# Patient Record
Sex: Female | Born: 1935 | Race: White | Hispanic: No | Marital: Married | Smoking: Never smoker
Health system: Southern US, Community
[De-identification: ages and names within clinical notes are randomized; demographics above are authoritative.]

## PROBLEM LIST (undated history)

## (undated) DIAGNOSIS — F329 Major depressive disorder, single episode, unspecified: Secondary | ICD-10-CM

## (undated) DIAGNOSIS — E7800 Pure hypercholesterolemia, unspecified: Secondary | ICD-10-CM

## (undated) DIAGNOSIS — E119 Type 2 diabetes mellitus without complications: Secondary | ICD-10-CM

## (undated) DIAGNOSIS — M810 Age-related osteoporosis without current pathological fracture: Secondary | ICD-10-CM

## (undated) DIAGNOSIS — I10 Essential (primary) hypertension: Secondary | ICD-10-CM

## (undated) DIAGNOSIS — F32A Depression, unspecified: Secondary | ICD-10-CM

## (undated) HISTORY — PX: ABDOMINAL HYSTERECTOMY: SHX81

## (undated) HISTORY — PX: APPENDECTOMY: SHX54

## (undated) HISTORY — PX: CHOLECYSTECTOMY: SHX55

---

## 2007-04-06 ENCOUNTER — Ambulatory Visit: Payer: Self-pay | Admitting: Family Medicine

## 2007-06-16 ENCOUNTER — Ambulatory Visit: Payer: Self-pay | Admitting: Internal Medicine

## 2007-09-22 ENCOUNTER — Ambulatory Visit: Payer: Self-pay | Admitting: Family Medicine

## 2008-06-18 ENCOUNTER — Ambulatory Visit: Payer: Self-pay | Admitting: Family Medicine

## 2008-07-02 ENCOUNTER — Ambulatory Visit: Payer: Self-pay | Admitting: Family Medicine

## 2009-04-08 ENCOUNTER — Ambulatory Visit: Payer: Self-pay | Admitting: Unknown Physician Specialty

## 2009-06-20 ENCOUNTER — Ambulatory Visit: Payer: Self-pay | Admitting: Internal Medicine

## 2010-09-10 ENCOUNTER — Ambulatory Visit: Payer: Self-pay | Admitting: Internal Medicine

## 2011-12-09 ENCOUNTER — Ambulatory Visit: Payer: Self-pay | Admitting: Internal Medicine

## 2012-11-19 ENCOUNTER — Ambulatory Visit: Payer: Self-pay | Admitting: Family Medicine

## 2013-03-31 ENCOUNTER — Emergency Department: Payer: Self-pay | Admitting: Emergency Medicine

## 2013-03-31 LAB — BASIC METABOLIC PANEL
Anion Gap: 4 — ABNORMAL LOW (ref 7–16)
BUN: 14 mg/dL (ref 7–18)
CALCIUM: 9.7 mg/dL (ref 8.5–10.1)
Chloride: 108 mmol/L — ABNORMAL HIGH (ref 98–107)
Co2: 27 mmol/L (ref 21–32)
Creatinine: 0.89 mg/dL (ref 0.60–1.30)
EGFR (African American): >60
Glucose: 178 mg/dL — ABNORMAL HIGH (ref 65–99)
Osmolality: 282 (ref 275–301)
Potassium: 3.8 mmol/L (ref 3.5–5.1)
SODIUM: 139 mmol/L (ref 136–145)

## 2013-03-31 LAB — CBC
HCT: 45.7 % (ref 35.0–47.0)
HGB: 15.2 g/dL (ref 12.0–16.0)
MCH: 30 pg (ref 26.0–34.0)
MCHC: 33.2 g/dL (ref 32.0–36.0)
MCV: 90 fL (ref 80–100)
PLATELETS: 178 10*3/uL (ref 150–440)
RBC: 5.06 10*6/uL (ref 3.80–5.20)
RDW: 12.6 % (ref 11.5–14.5)
WBC: 8.4 10*3/uL (ref 3.6–11.0)

## 2013-03-31 LAB — TROPONIN I

## 2013-11-24 DIAGNOSIS — E119 Type 2 diabetes mellitus without complications: Secondary | ICD-10-CM | POA: Insufficient documentation

## 2013-11-24 DIAGNOSIS — E559 Vitamin D deficiency, unspecified: Secondary | ICD-10-CM | POA: Insufficient documentation

## 2013-11-24 DIAGNOSIS — E1140 Type 2 diabetes mellitus with diabetic neuropathy, unspecified: Secondary | ICD-10-CM | POA: Insufficient documentation

## 2013-11-24 DIAGNOSIS — F419 Anxiety disorder, unspecified: Secondary | ICD-10-CM

## 2013-11-24 DIAGNOSIS — E782 Mixed hyperlipidemia: Secondary | ICD-10-CM

## 2013-11-24 DIAGNOSIS — G629 Polyneuropathy, unspecified: Secondary | ICD-10-CM | POA: Insufficient documentation

## 2013-11-24 DIAGNOSIS — M8580 Other specified disorders of bone density and structure, unspecified site: Secondary | ICD-10-CM | POA: Insufficient documentation

## 2013-11-24 DIAGNOSIS — E1169 Type 2 diabetes mellitus with other specified complication: Secondary | ICD-10-CM | POA: Insufficient documentation

## 2013-11-24 DIAGNOSIS — R12 Heartburn: Secondary | ICD-10-CM | POA: Insufficient documentation

## 2013-11-24 DIAGNOSIS — F5104 Psychophysiologic insomnia: Secondary | ICD-10-CM | POA: Insufficient documentation

## 2013-11-24 DIAGNOSIS — F329 Major depressive disorder, single episode, unspecified: Secondary | ICD-10-CM | POA: Insufficient documentation

## 2013-12-06 ENCOUNTER — Ambulatory Visit: Payer: Self-pay | Admitting: Internal Medicine

## 2014-11-11 ENCOUNTER — Ambulatory Visit
Admission: EM | Admit: 2014-11-11 | Discharge: 2014-11-11 | Disposition: A | Discharge status: Home / Self Care | Payer: PPO | Attending: Family Medicine | Admitting: Family Medicine

## 2014-11-11 ENCOUNTER — Encounter: Payer: Self-pay | Admitting: *Deleted

## 2014-11-11 DIAGNOSIS — N390 Urinary tract infection, site not specified: Secondary | ICD-10-CM | POA: Diagnosis not present

## 2014-11-11 DIAGNOSIS — W19XXXA Unspecified fall, initial encounter: Secondary | ICD-10-CM | POA: Diagnosis not present

## 2014-11-11 HISTORY — DX: Type 2 diabetes mellitus without complications: E11.9

## 2014-11-11 HISTORY — DX: Essential (primary) hypertension: I10

## 2014-11-11 LAB — URINALYSIS COMPLETE WITH MICROSCOPIC (ARMC ONLY)
Glucose, UA: NEGATIVE mg/dL
Hgb urine dipstick: NEGATIVE
NITRITE: NEGATIVE
PH: 5 (ref 5.0–8.0)
PROTEIN: NEGATIVE mg/dL
SPECIFIC GRAVITY, URINE: 1.025 (ref 1.005–1.030)

## 2014-11-11 MED ORDER — FLUCONAZOLE 150 MG PO TABS: 150.0000 mg | ORAL_TABLET | Freq: Once | ORAL | Status: DC

## 2014-11-11 MED ORDER — NITROFURANTOIN MONOHYD MACRO 100 MG PO CAPS: 100.0000 mg | ORAL_CAPSULE | Freq: Two times a day (BID) | ORAL | Status: DC

## 2014-11-11 NOTE — ED Notes (Signed)
Pt states that she would like to be check for a UTI, she has had falls recently, urine has a strong odor

## 2014-11-11 NOTE — Discharge Instructions (Signed)
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
## 2014-11-13 LAB — URINE CULTURE
Culture: >=100000
Special Requests: NORMAL

## 2015-03-20 DIAGNOSIS — H2513 Age-related nuclear cataract, bilateral: Secondary | ICD-10-CM | POA: Diagnosis not present

## 2015-04-11 DIAGNOSIS — A499 Bacterial infection, unspecified: Secondary | ICD-10-CM | POA: Diagnosis not present

## 2015-04-11 DIAGNOSIS — N390 Urinary tract infection, site not specified: Secondary | ICD-10-CM | POA: Diagnosis not present

## 2015-04-11 DIAGNOSIS — R350 Frequency of micturition: Secondary | ICD-10-CM | POA: Diagnosis not present

## 2015-05-03 DIAGNOSIS — H2513 Age-related nuclear cataract, bilateral: Secondary | ICD-10-CM | POA: Diagnosis not present

## 2015-05-03 DIAGNOSIS — H40033 Anatomical narrow angle, bilateral: Secondary | ICD-10-CM | POA: Diagnosis not present

## 2015-05-24 DIAGNOSIS — H2513 Age-related nuclear cataract, bilateral: Secondary | ICD-10-CM | POA: Diagnosis not present

## 2015-05-27 ENCOUNTER — Encounter: Payer: Self-pay | Admitting: *Deleted

## 2015-05-31 NOTE — Discharge Instructions (Signed)

## 2015-06-03 ENCOUNTER — Ambulatory Visit: Payer: PPO | Admitting: Anesthesiology

## 2015-06-03 ENCOUNTER — Ambulatory Visit
Admission: RE | Admit: 2015-06-03 | Discharge: 2015-06-03 | Disposition: A | Discharge status: Home / Self Care | Payer: PPO | Source: Ambulatory Visit | Attending: Ophthalmology | Admitting: Ophthalmology

## 2015-06-03 ENCOUNTER — Encounter
Admission: RE | Disposition: A | Discharge status: Home / Self Care | Payer: Self-pay | Source: Ambulatory Visit | Attending: Ophthalmology

## 2015-06-03 DIAGNOSIS — Z90710 Acquired absence of both cervix and uterus: Secondary | ICD-10-CM | POA: Insufficient documentation

## 2015-06-03 DIAGNOSIS — E7800 Pure hypercholesterolemia, unspecified: Secondary | ICD-10-CM | POA: Diagnosis not present

## 2015-06-03 DIAGNOSIS — F329 Major depressive disorder, single episode, unspecified: Secondary | ICD-10-CM | POA: Insufficient documentation

## 2015-06-03 DIAGNOSIS — M810 Age-related osteoporosis without current pathological fracture: Secondary | ICD-10-CM | POA: Insufficient documentation

## 2015-06-03 DIAGNOSIS — E1140 Type 2 diabetes mellitus with diabetic neuropathy, unspecified: Secondary | ICD-10-CM | POA: Diagnosis not present

## 2015-06-03 DIAGNOSIS — H2512 Age-related nuclear cataract, left eye: Secondary | ICD-10-CM | POA: Diagnosis not present

## 2015-06-03 DIAGNOSIS — I10 Essential (primary) hypertension: Secondary | ICD-10-CM | POA: Diagnosis not present

## 2015-06-03 DIAGNOSIS — Z9049 Acquired absence of other specified parts of digestive tract: Secondary | ICD-10-CM | POA: Diagnosis not present

## 2015-06-03 DIAGNOSIS — H2513 Age-related nuclear cataract, bilateral: Secondary | ICD-10-CM | POA: Diagnosis not present

## 2015-06-03 HISTORY — DX: Major depressive disorder, single episode, unspecified: F32.9

## 2015-06-03 HISTORY — PX: CATARACT EXTRACTION W/PHACO: SHX586

## 2015-06-03 HISTORY — DX: Depression, unspecified: F32.A

## 2015-06-03 HISTORY — DX: Age-related osteoporosis without current pathological fracture: M81.0

## 2015-06-03 HISTORY — DX: Pure hypercholesterolemia, unspecified: E78.00

## 2015-06-03 LAB — GLUCOSE, CAPILLARY
GLUCOSE-CAPILLARY: 85 mg/dL (ref 65–99)
Glucose-Capillary: 106 mg/dL — ABNORMAL HIGH (ref 65–99)

## 2015-06-03 SURGERY — PHACOEMULSIFICATION, CATARACT, WITH IOL INSERTION
Anesthesia: Monitor Anesthesia Care | Laterality: Left | Wound class: Clean

## 2015-06-03 MED ORDER — FENTANYL CITRATE (PF) 100 MCG/2ML IJ SOLN
INTRAMUSCULAR | Status: DC | PRN
  Administered 2015-06-03: 50 ug via INTRAVENOUS

## 2015-06-03 MED ORDER — ACETAMINOPHEN 160 MG/5ML PO SOLN
325.0000 mg | ORAL | Status: DC | PRN
Start: 2015-06-03 — End: 2015-06-03

## 2015-06-03 MED ORDER — ARMC OPHTHALMIC DILATING GEL
1.0000 "application " | OPHTHALMIC | Status: DC | PRN
  Administered 2015-06-03 (×2): 1 via OPHTHALMIC

## 2015-06-03 MED ORDER — TIMOLOL MALEATE 0.5 % OP SOLN
OPHTHALMIC | Status: DC | PRN
  Administered 2015-06-03: 1 [drp] via OPHTHALMIC

## 2015-06-03 MED ORDER — NA HYALUR & NA CHOND-NA HYALUR 0.4-0.35 ML IO KIT
PACK | INTRAOCULAR | Status: DC | PRN
  Administered 2015-06-03: 1 mL via INTRAOCULAR

## 2015-06-03 MED ORDER — POVIDONE-IODINE 5 % OP SOLN
1.0000 "application " | OPHTHALMIC | Status: DC | PRN
  Administered 2015-06-03: 1 via OPHTHALMIC

## 2015-06-03 MED ORDER — MIDAZOLAM HCL 2 MG/2ML IJ SOLN
INTRAMUSCULAR | Status: DC | PRN
  Administered 2015-06-03: 2 mg via INTRAVENOUS

## 2015-06-03 MED ORDER — CEFUROXIME OPHTHALMIC INJECTION 1 MG/0.1 ML
INJECTION | OPHTHALMIC | Status: DC | PRN
  Administered 2015-06-03: 0.1 mL via INTRACAMERAL

## 2015-06-03 MED ORDER — BSS IO SOLN
INTRAOCULAR | Status: DC | PRN
  Administered 2015-06-03: 85 mL

## 2015-06-03 MED ORDER — TETRACAINE HCL 0.5 % OP SOLN
1.0000 [drp] | OPHTHALMIC | Status: DC | PRN
  Administered 2015-06-03: 1 [drp] via OPHTHALMIC

## 2015-06-03 MED ORDER — BRIMONIDINE TARTRATE 0.2 % OP SOLN
OPHTHALMIC | Status: DC | PRN
  Administered 2015-06-03: 1 [drp] via OPHTHALMIC

## 2015-06-03 MED ORDER — LIDOCAINE HCL (PF) 4 % IJ SOLN
INTRAOCULAR | Status: DC | PRN
  Administered 2015-06-03: 1 mL via OPHTHALMIC

## 2015-06-03 MED ORDER — ACETAMINOPHEN 325 MG PO TABS: 325.0000 mg | ORAL_TABLET | ORAL | Status: DC | PRN

## 2015-06-03 MED ORDER — LACTATED RINGERS IV SOLN: INTRAVENOUS | Status: DC

## 2015-06-03 SURGICAL SUPPLY — 28 items

## 2015-06-03 NOTE — Anesthesia Preprocedure Evaluation (Signed)
Anesthesia Evaluation  Patient identified by MRN, date of birth, ID band  Reviewed: Allergy & Precautions, H&P , NPO status , Patient's Chart, lab work & pertinent test results  Airway Mallampati: II  TM Distance: >3 FB Neck ROM: full    Dental no notable dental hx.    Pulmonary    Pulmonary exam normal       Cardiovascular hypertension, Rhythm:regular Rate:Normal     Neuro/Psych    GI/Hepatic   Endo/Other  diabetes  Renal/GU      Musculoskeletal   Abdominal   Peds  Hematology   Anesthesia Other Findings   Reproductive/Obstetrics                             Anesthesia Physical Anesthesia Plan  ASA: II  Anesthesia Plan: MAC   Post-op Pain Management:    Induction:   Airway Management Planned:   Additional Equipment:   Intra-op Plan:   Post-operative Plan:   Informed Consent: I have reviewed the patients History and Physical, chart, labs and discussed the procedure including the risks, benefits and alternatives for the proposed anesthesia with the patient or authorized representative who has indicated his/her understanding and acceptance.     Plan Discussed with: CRNA  Anesthesia Plan Comments:         Anesthesia Quick Evaluation  

## 2015-06-03 NOTE — H&P (Signed)
H+P reviewed and is up to date, please see paper chart.  

## 2015-06-03 NOTE — Transfer of Care (Signed)
Immediate Anesthesia Transfer of Care Note  Patient: Sarah MartinsRuth A Compean  Procedure(s) Performed: Procedure(s) with comments: CATARACT EXTRACTION PHACO AND INTRAOCULAR LENS PLACEMENT (IOC) (Left) - DIABETIC - oral meds keep Norva RiffleMartins together  Patient Location: PACU  Anesthesia Type: MAC  Level of Consciousness: awake, alert  and patient cooperative  Airway and Oxygen Therapy: Patient Spontanous Breathing and Patient connected to supplemental oxygen  Post-op Assessment: Post-op Vital signs reviewed, Patient's Cardiovascular Status Stable, Respiratory Function Stable, Patent Airway and No signs of Nausea or vomiting  Post-op Vital Signs: Reviewed and stable  Complications: No apparent anesthesia complications

## 2015-06-03 NOTE — Anesthesia Postprocedure Evaluation (Signed)
Anesthesia Post Note  Patient: Sarah Baird  Procedure(s) Performed: Procedure(s) (LRB): CATARACT EXTRACTION PHACO AND INTRAOCULAR LENS PLACEMENT (IOC) (Left)  Patient location during evaluation: PACU Anesthesia Type: MAC Level of consciousness: awake and alert and oriented Pain management: satisfactory to patient Vital Signs Assessment: post-procedure vital signs reviewed and stable Respiratory status: spontaneous breathing, nonlabored ventilation and respiratory function stable Cardiovascular status: blood pressure returned to baseline and stable Postop Assessment: Adequate PO intake and No signs of nausea or vomiting Anesthetic complications: no    Cherly BeachStella, Shaynna Husby J

## 2015-06-03 NOTE — Anesthesia Procedure Notes (Signed)
Procedure Name: MAC Performed by: Prabhjot Piscitello Pre-anesthesia Checklist: Patient identified, Emergency Drugs available, Suction available, Timeout performed and Patient being monitored Patient Re-evaluated:Patient Re-evaluated prior to inductionOxygen Delivery Method: Nasal cannula Placement Confirmation: positive ETCO2     

## 2015-06-03 NOTE — Op Note (Signed)
Date of Surgery: 06/03/2015  PREOPERATIVE DIAGNOSES: Visually significant nuclear sclerotic cataract, left eye.  POSTOPERATIVE DIAGNOSES: Same  PROCEDURES PERFORMED: Cataract extraction with intraocular lens implant, left eye.  SURGEON: Devin GoingAnita P. Vin, M.D.  ANESTHESIA: MAC and topical  IMPLANTS: [id number] +22.0 D  COMPLICATIONS: None.  DESCRIPTION OF PROCEDURE: Therapeutic options were discussed with the patient preoperatively, including a discussion of risks and benefits of surgery. Informed consent was obtained. An IOL-Master and immersion biometry were used to take the lens measurements, and a dilated fundus exam was performed within 6 months of the surgical date.  The patient was premedicated and brought to the operating room and placed on the operating table in the supine position. After adequate anesthesia, the patient was prepped and draped in the usual sterile ophthalmic fashion. A wire lid speculum was inserted and the microscope was positioned. A Superblade was used to create a paracentesis site at the limbus and a small amount of dilute preservative free lidocaine was instilled into the anterior chamber, followed by dispersive viscoelastic. A clear corneal incision was created temporally using a 2.4 mm keratome blade. Capsulorrhexis was then performed. In situ phacoemulsification was performed.  Cortical material was removed with the irrigation-aspiration unit. Dispersive viscoelastic was instilled to open the capsular bag. A posterior chamber intraocular lens with the specifications above was inserted and positioned. Irrigation-aspiration was used to remove all viscoelastic. Cefuroxime 1cc was instilled into the anterior chamber, and the corneal incision was checked and found to be water tight. The eyelid speculum was removed.  The operative eye was covered with protective goggles after instilling 1 drop of timolol and brimonidine. The patient tolerated the procedure well. There were no  complications.

## 2015-06-04 ENCOUNTER — Encounter: Payer: Self-pay | Admitting: Ophthalmology

## 2015-06-10 DIAGNOSIS — G63 Polyneuropathy in diseases classified elsewhere: Secondary | ICD-10-CM | POA: Diagnosis not present

## 2015-06-10 DIAGNOSIS — E782 Mixed hyperlipidemia: Secondary | ICD-10-CM | POA: Diagnosis not present

## 2015-06-10 DIAGNOSIS — Z79899 Other long term (current) drug therapy: Secondary | ICD-10-CM | POA: Diagnosis not present

## 2015-06-10 DIAGNOSIS — F5104 Psychophysiologic insomnia: Secondary | ICD-10-CM | POA: Diagnosis not present

## 2015-06-10 DIAGNOSIS — I10 Essential (primary) hypertension: Secondary | ICD-10-CM | POA: Diagnosis not present

## 2015-06-10 DIAGNOSIS — E1140 Type 2 diabetes mellitus with diabetic neuropathy, unspecified: Secondary | ICD-10-CM | POA: Diagnosis not present

## 2015-06-10 DIAGNOSIS — E559 Vitamin D deficiency, unspecified: Secondary | ICD-10-CM | POA: Diagnosis not present

## 2015-06-10 DIAGNOSIS — M810 Age-related osteoporosis without current pathological fracture: Secondary | ICD-10-CM | POA: Diagnosis not present

## 2015-06-10 DIAGNOSIS — R12 Heartburn: Secondary | ICD-10-CM | POA: Diagnosis not present

## 2015-06-15 IMAGING — CT CT CHEST W/O CM
2 of 4 series · 15 of 36 positions shown, 18 images · non-contrast
Comparison: Chest radiograph performed earlier today at [DATE] p.m.

CLINICAL DATA: Status post fall, hitting a table; left-sided chest
pain and back pain. Large bruise to the left chest wall.
Tachycardia.

EXAM:
CT CHEST WITHOUT CONTRAST
TECHNIQUE: Multidetector CT imaging of the chest was performed following the
standard protocol without IV contrast.

[Series 3: lung · axial · 0.60mm/px · z∈[-540,-334]mm · 12 of 49 slices shown, 15 images]
[im 4/49  mediastinal]
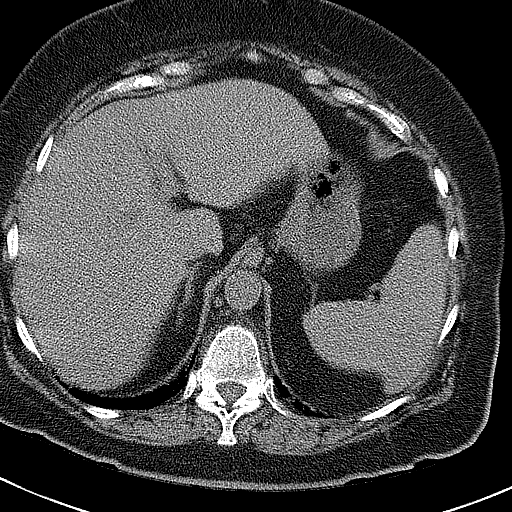
[im 4/49  lung]
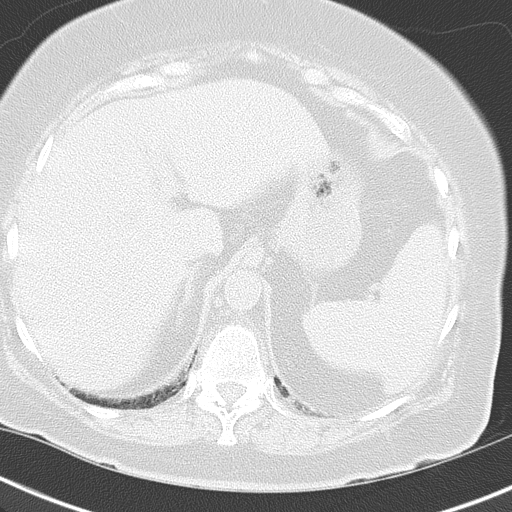
[im 8/49  lung]
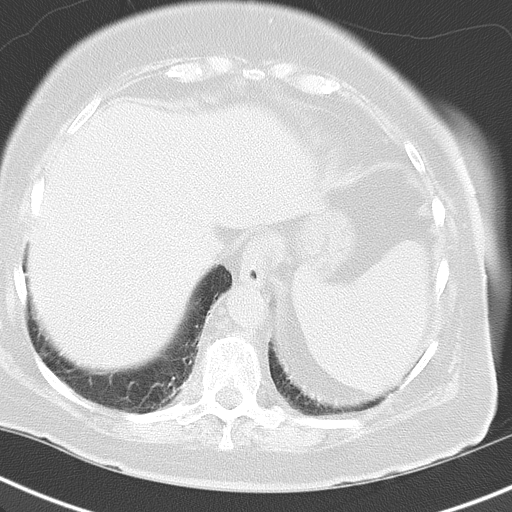
[im 12/49  lung]
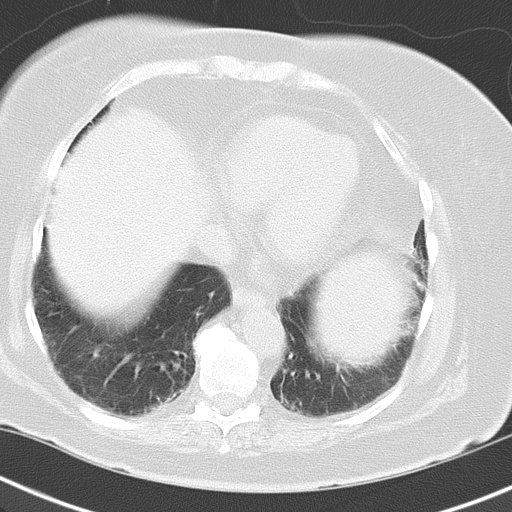
[im 15/49  lung]
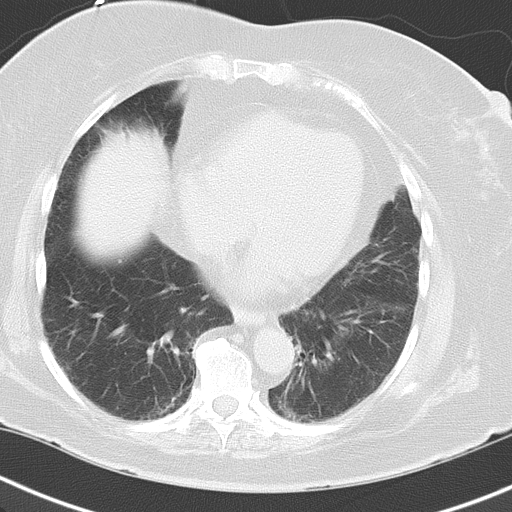
[im 19/49  mediastinal]
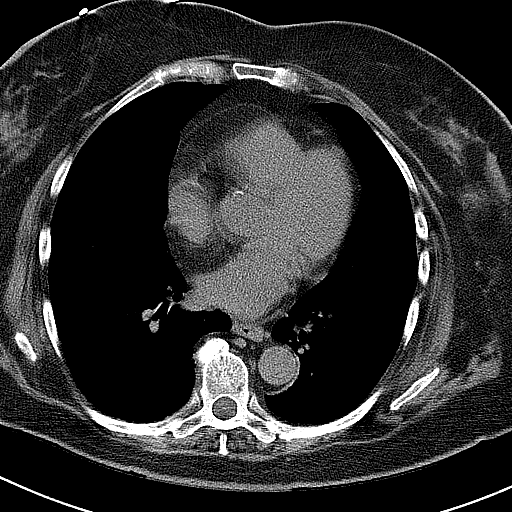
[im 19/49  lung]
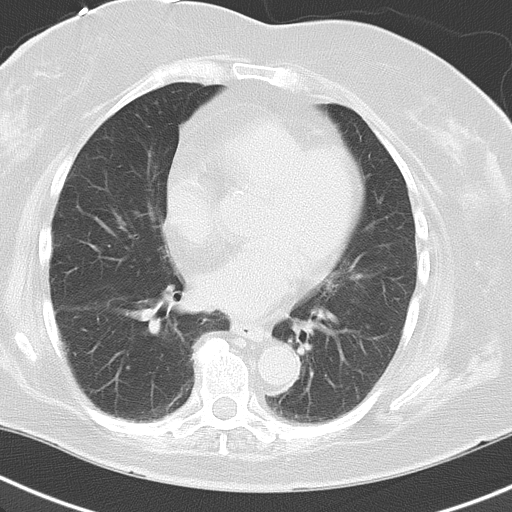
[im 23/49  lung]
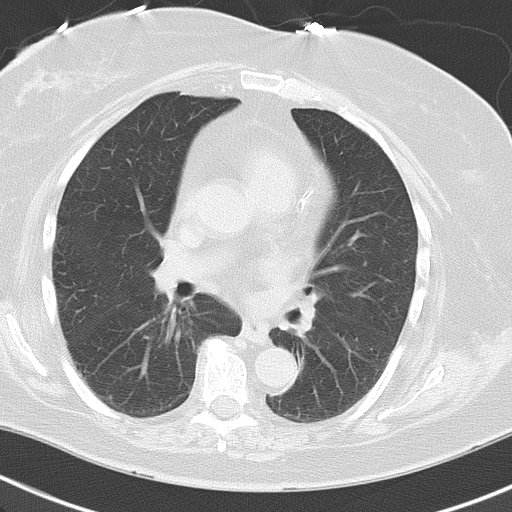
[im 26/49  lung]
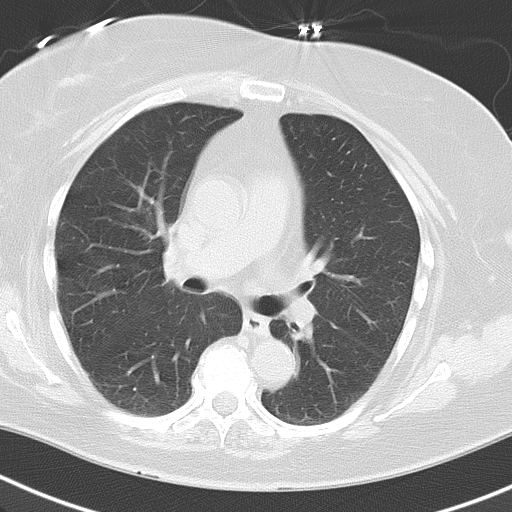
[im 30/49  lung]
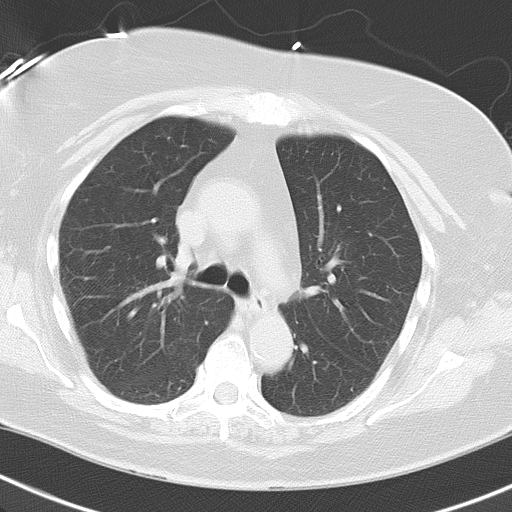
[im 34/49  mediastinal]
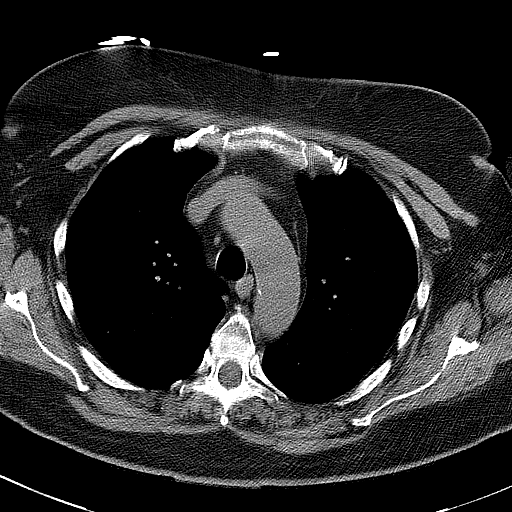
[im 34/49  lung]
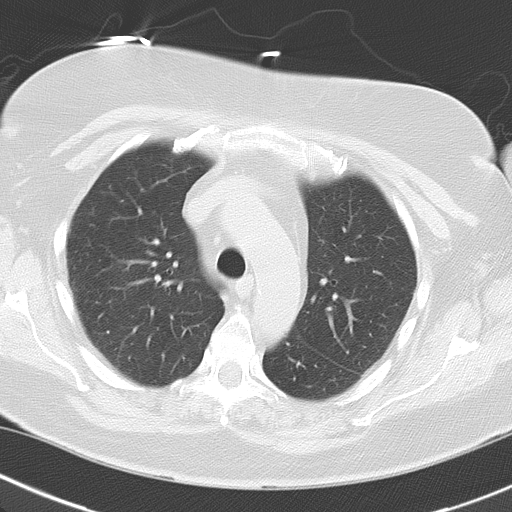
[im 37/49  lung]
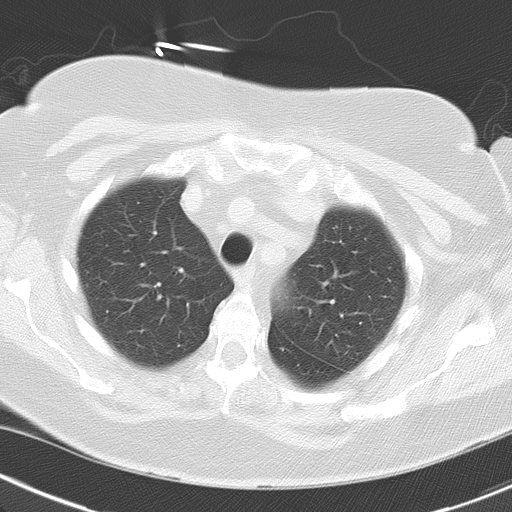
[im 41/49  lung]
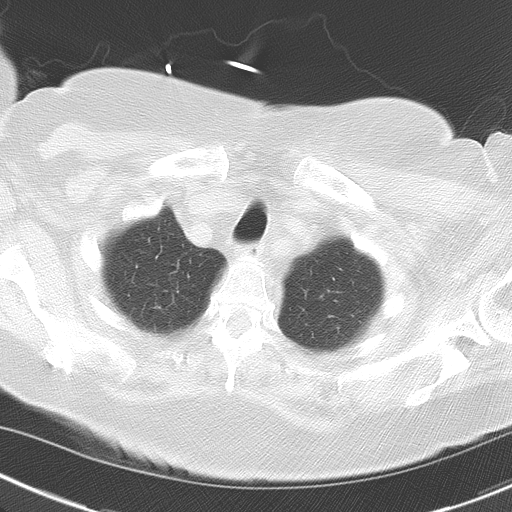
[im 45/49  lung]
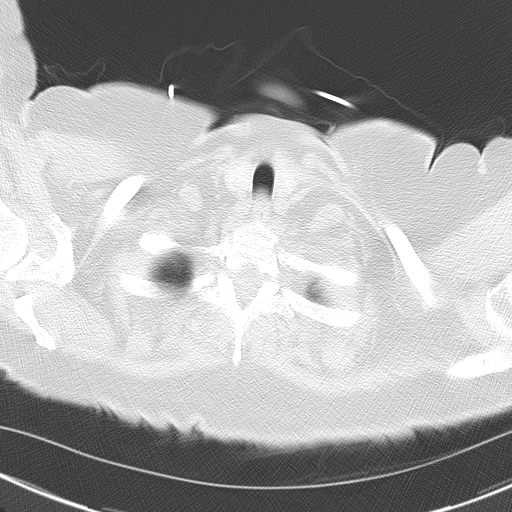

[Series 5: cor routine chest wo · coronal · 0.55mm/px · 3 of 116 slices shown]
[im 24/116  lung]
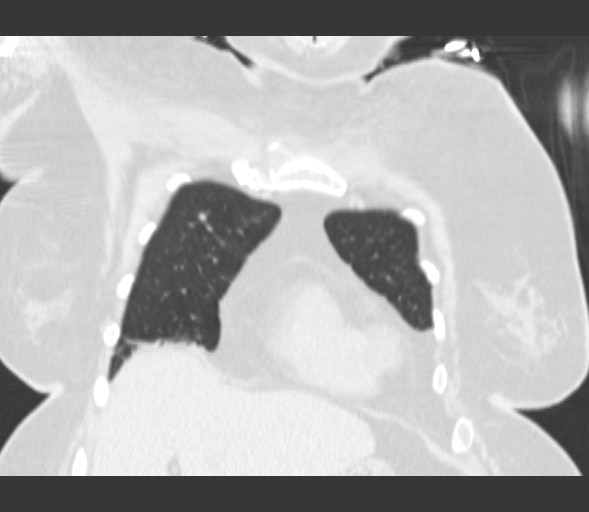
[im 47/116  lung]
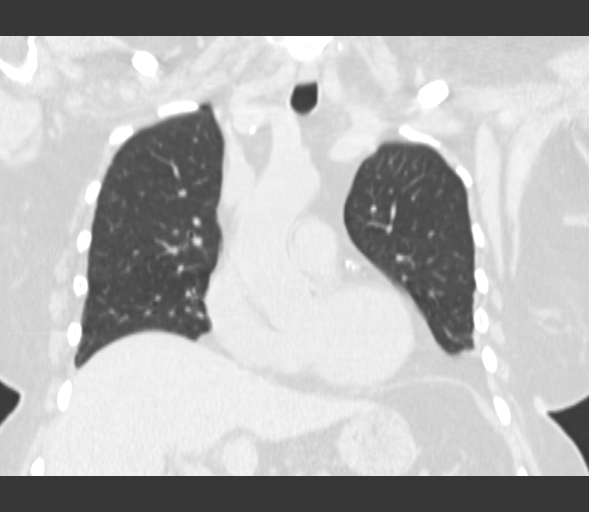
[im 70/116  lung]
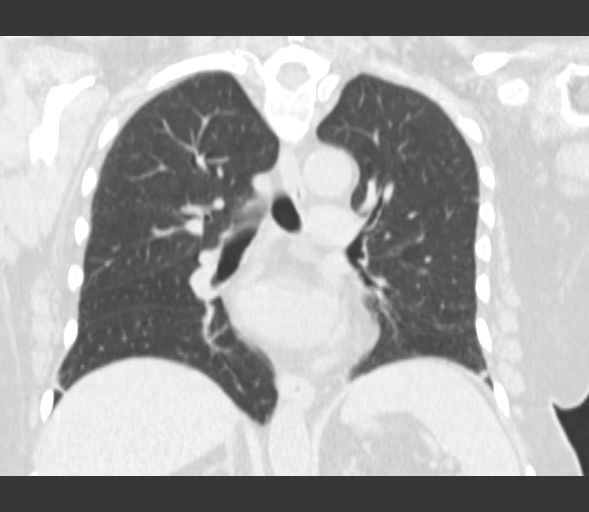

[15 of 36 positions shown; findings below may reference images not displayed]

FINDINGS: Minimal left basilar atelectasis or scarring is noted. Scattered
tiny nodules are seen within the lungs, thought to be post
infectious in nature given their multiplicity. The lungs are
otherwise clear. No focal consolidation, pleural effusion or
pneumothorax is seen. There is no evidence of pulmonary parenchymal
contusion. No masses are identified.

Scattered coronary artery calcifications are seen. The mediastinum
is otherwise unremarkable in appearance. No mediastinal
lymphadenopathy is seen. No pericardial effusion is identified. The
great vessels are grossly unremarkable in appearance. The visualized
portions of the thyroid gland are unremarkable in appearance. No
axillary lymphadenopathy is seen.

The visualized portions of the liver and spleen are unremarkable.

No acute osseous abnormalities are identified. There is no evidence
of displaced fracture.
IMPRESSION: 1. No evidence of displaced fracture; no evidence of traumatic
injury to the chest.
2. Minimal left basilar atelectasis or scarring noted. Scattered
tiny nodules within the lungs are thought to be post infectious in
nature, given their multiplicity. Lungs otherwise clear.
3. Scattered coronary artery calcifications seen.

## 2015-06-15 IMAGING — CT CT LUMBAR SPINE WITHOUT CONTRAST
3 of 9 series · 13 of 33 positions shown, 16 images · non-contrast
Comparison: None.

CLINICAL DATA: Status post fall; lower back pain.

EXAM:
CT LUMBAR SPINE WITHOUT CONTRAST
TECHNIQUE: Multidetector CT imaging of the lumbar spine was performed without
intravenous contrast administration. Multiplanar CT image
reconstructions were also generated.

[Series 3: l spine soft · axial · 0.39mm/px · z∈[-754,-574]mm · 5 of 126 slices shown, 7 images]
[im 18/126  soft-tissue]
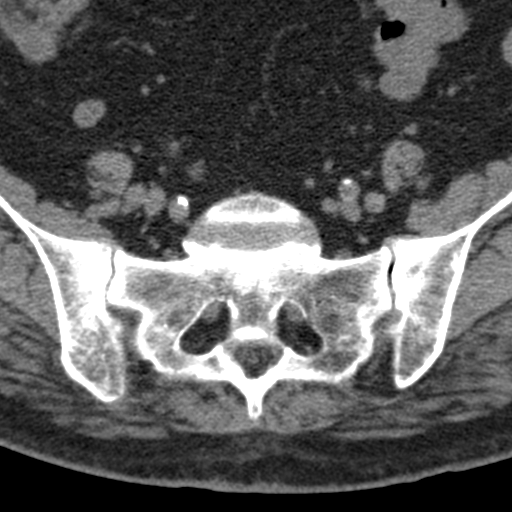
[im 18/126  bone]
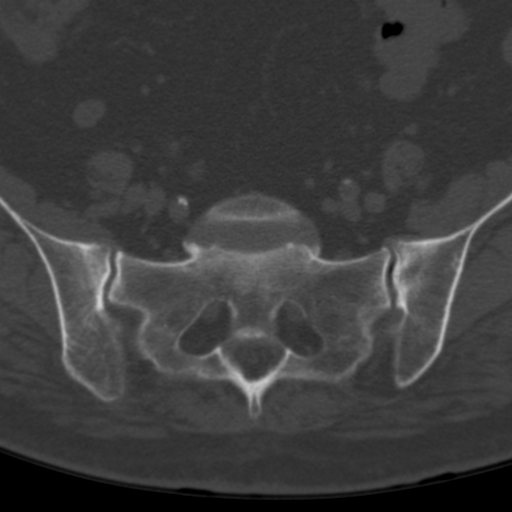
[im 36/126  bone]
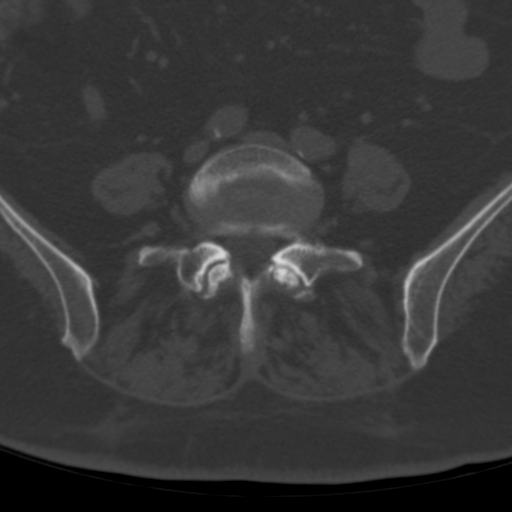
[im 72/126  bone]
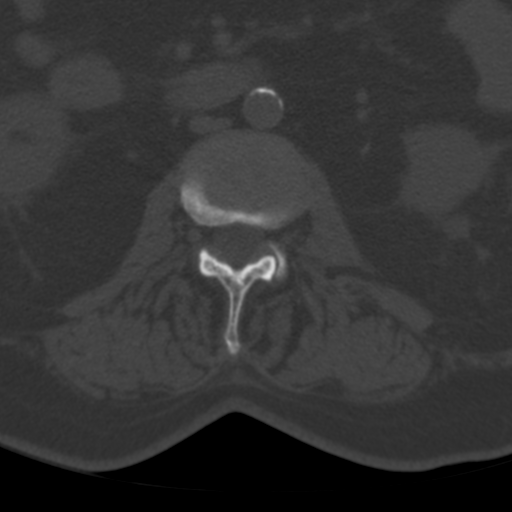
[im 90/126  bone]
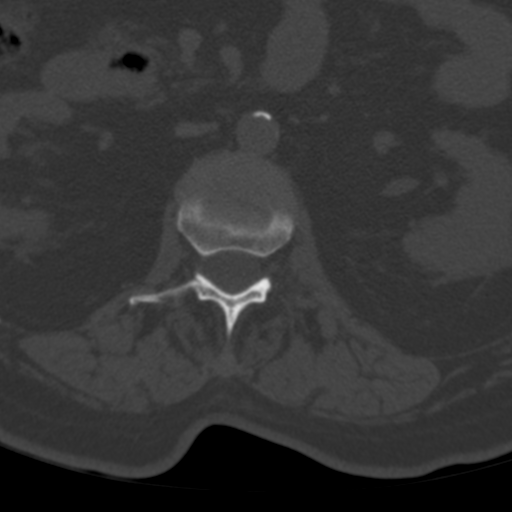
[im 108/126  soft-tissue]
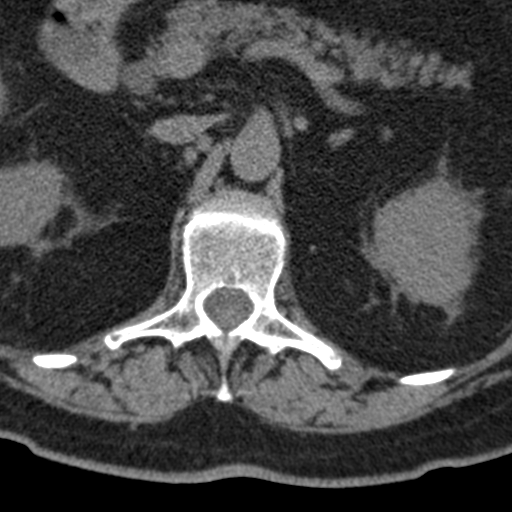
[im 108/126  bone]
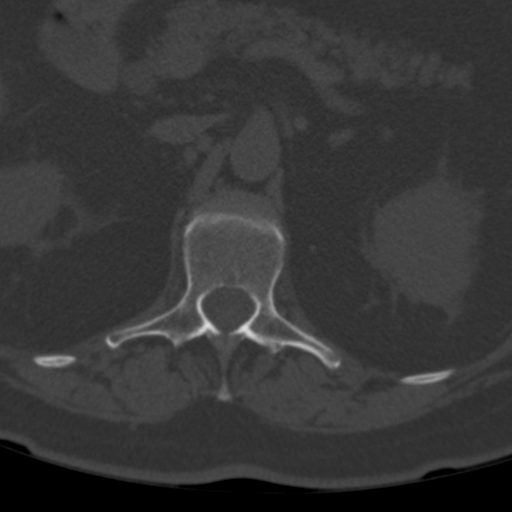

[Series 4: sagittal bone · sagittal · 0.35mm/px · 5 of 74 slices shown, 6 images]
[im 25/74  bone]
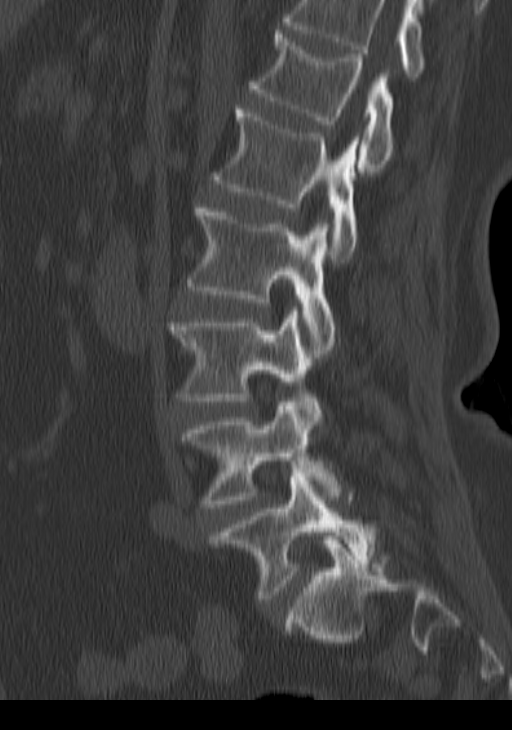
[im 31/74  bone]
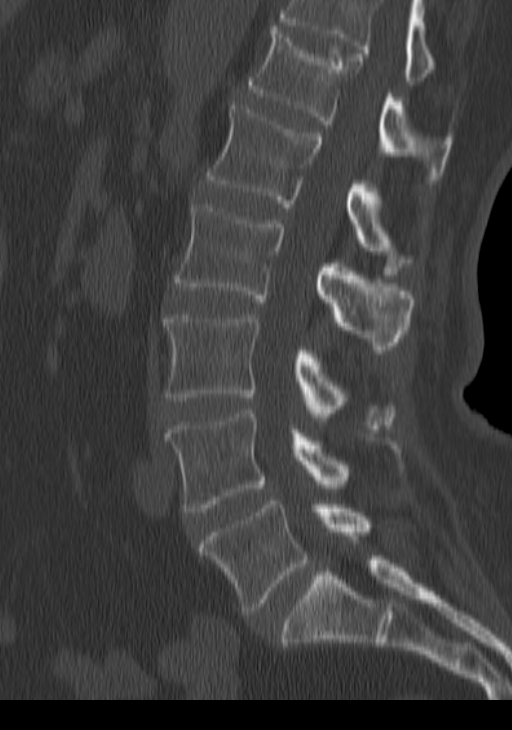
[im 37/74  soft-tissue]
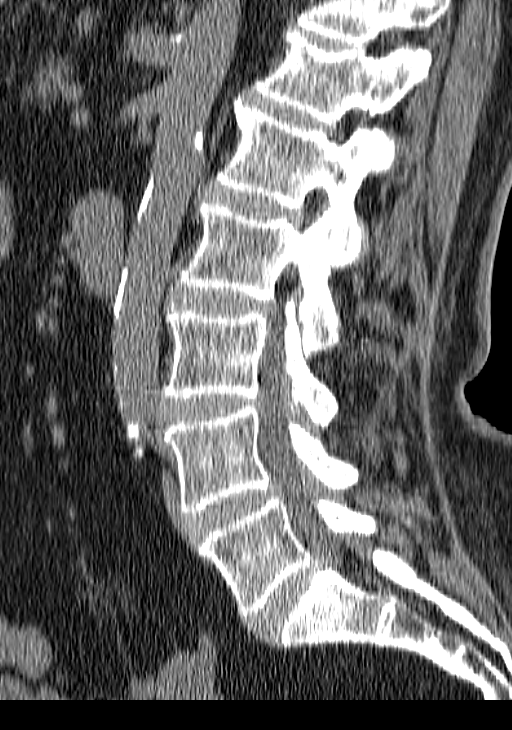
[im 37/74  bone]
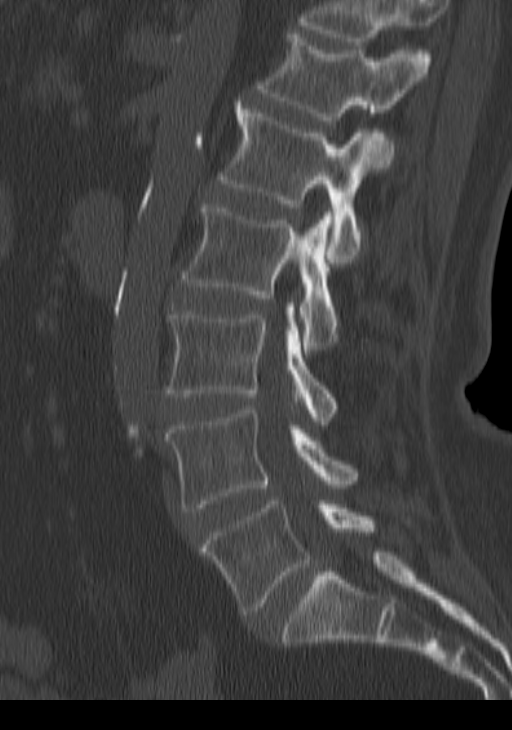
[im 43/74  bone]
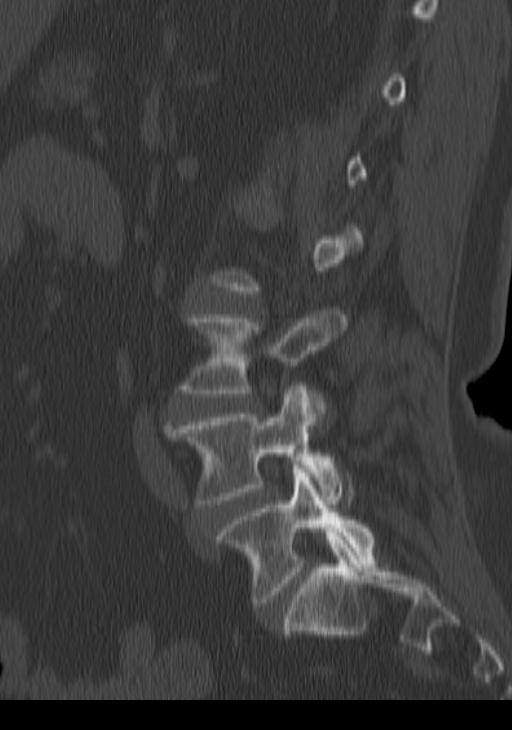
[im 49/74  bone]
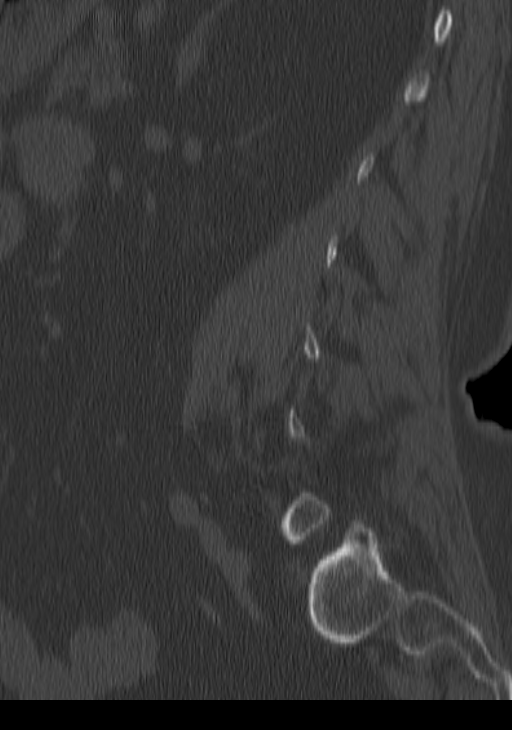

[Series 5: coronal bone · coronal · 0.38mm/px · 3 of 69 slices shown]
[im 14/69  bone]
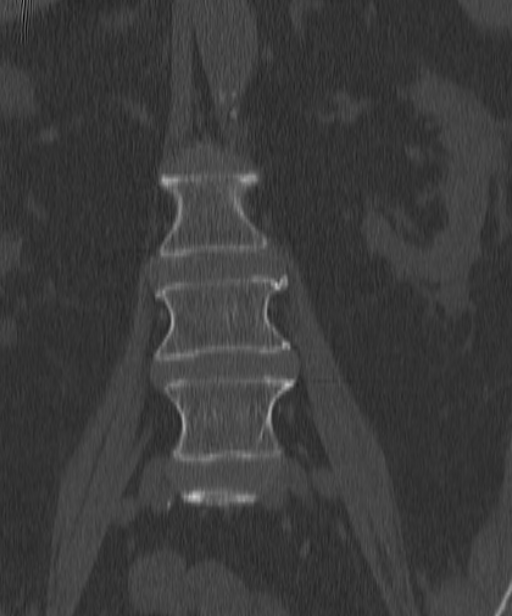
[im 28/69  bone]
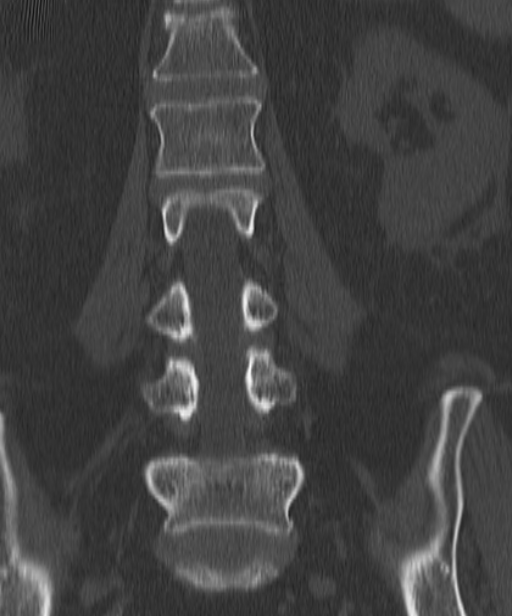
[im 41/69  bone]
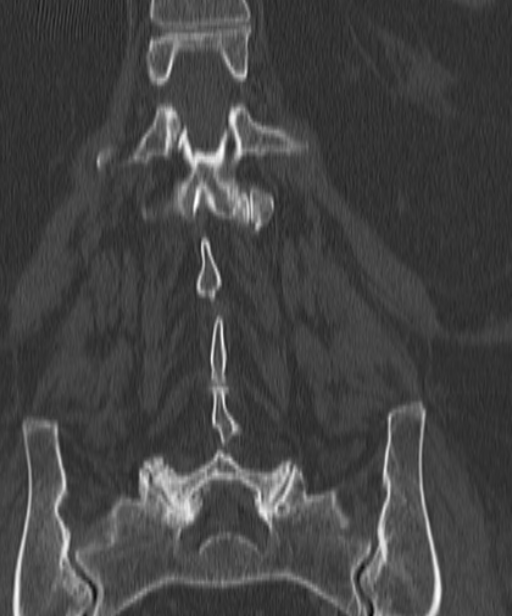

[13 of 33 positions shown; findings below may reference images not displayed]

FINDINGS: There appears to be mild acute compression fracture involving the
superior endplate of T12, with approximately 25% loss of height at
T12. This may extend to the posterior aspect of the vertebral body,
but there is no evidence of involvement of the posterior elements or
retropulsion.

Intervertebral disc spaces are preserved. Visualized neural foramina
are grossly unremarkable, without evidence of significant foraminal
narrowing. No significant disc protrusions are identified. Mild
facet disease is noted at the lower lumbar spine.

Nonspecific perinephric stranding is noted bilaterally. Scattered
calcification is noted along the abdominal aorta and its branches.
Visualized small and large bowel loops are grossly unremarkable in
appearance. The patient is status post cholecystectomy, with clips
noted along the gallbladder fossa.
IMPRESSION: 1. Mild acute compression fracture involving the superior endplate
of T12, with approximately 25% loss of height at T12. This may
extend to the posterior aspect of the vertebral body, but there is
no evidence of involvement of the posterior elements or
retropulsion.
2. Scattered calcification along the abdominal aorta and its
branches.

## 2015-06-17 DIAGNOSIS — M810 Age-related osteoporosis without current pathological fracture: Secondary | ICD-10-CM | POA: Diagnosis not present

## 2015-06-19 ENCOUNTER — Encounter: Payer: Self-pay | Admitting: *Deleted

## 2015-06-19 DIAGNOSIS — H2511 Age-related nuclear cataract, right eye: Secondary | ICD-10-CM | POA: Diagnosis not present

## 2015-06-21 NOTE — Discharge Instructions (Signed)

## 2015-06-24 ENCOUNTER — Encounter: Payer: Self-pay | Admitting: *Deleted

## 2015-06-24 ENCOUNTER — Ambulatory Visit: Payer: PPO | Admitting: Anesthesiology

## 2015-06-24 ENCOUNTER — Encounter
Admission: RE | Disposition: A | Discharge status: Home / Self Care | Payer: Self-pay | Source: Ambulatory Visit | Attending: Ophthalmology

## 2015-06-24 ENCOUNTER — Ambulatory Visit
Admission: RE | Admit: 2015-06-24 | Discharge: 2015-06-24 | Disposition: A | Discharge status: Home / Self Care | Payer: PPO | Source: Ambulatory Visit | Attending: Ophthalmology | Admitting: Ophthalmology

## 2015-06-24 DIAGNOSIS — E119 Type 2 diabetes mellitus without complications: Secondary | ICD-10-CM | POA: Insufficient documentation

## 2015-06-24 DIAGNOSIS — G629 Polyneuropathy, unspecified: Secondary | ICD-10-CM | POA: Diagnosis not present

## 2015-06-24 DIAGNOSIS — E7800 Pure hypercholesterolemia, unspecified: Secondary | ICD-10-CM | POA: Insufficient documentation

## 2015-06-24 DIAGNOSIS — F329 Major depressive disorder, single episode, unspecified: Secondary | ICD-10-CM | POA: Diagnosis not present

## 2015-06-24 DIAGNOSIS — I10 Essential (primary) hypertension: Secondary | ICD-10-CM | POA: Diagnosis not present

## 2015-06-24 DIAGNOSIS — Z90710 Acquired absence of both cervix and uterus: Secondary | ICD-10-CM | POA: Diagnosis not present

## 2015-06-24 DIAGNOSIS — Z9049 Acquired absence of other specified parts of digestive tract: Secondary | ICD-10-CM | POA: Diagnosis not present

## 2015-06-24 DIAGNOSIS — H2511 Age-related nuclear cataract, right eye: Secondary | ICD-10-CM | POA: Insufficient documentation

## 2015-06-24 DIAGNOSIS — M810 Age-related osteoporosis without current pathological fracture: Secondary | ICD-10-CM | POA: Diagnosis not present

## 2015-06-24 HISTORY — PX: CATARACT EXTRACTION W/PHACO: SHX586

## 2015-06-24 LAB — GLUCOSE, CAPILLARY
Glucose-Capillary: 111 mg/dL — ABNORMAL HIGH (ref 65–99)
Glucose-Capillary: 111 mg/dL — ABNORMAL HIGH (ref 65–99)

## 2015-06-24 SURGERY — PHACOEMULSIFICATION, CATARACT, WITH IOL INSERTION
Anesthesia: Monitor Anesthesia Care | Site: Eye | Laterality: Right | Wound class: Clean

## 2015-06-24 MED ORDER — TETRACAINE HCL 0.5 % OP SOLN
1.0000 [drp] | OPHTHALMIC | Status: DC | PRN
  Administered 2015-06-24: 1 [drp] via OPHTHALMIC

## 2015-06-24 MED ORDER — MIDAZOLAM HCL 2 MG/2ML IJ SOLN
INTRAMUSCULAR | Status: DC | PRN
  Administered 2015-06-24: 2 mg via INTRAVENOUS

## 2015-06-24 MED ORDER — ONDANSETRON HCL 4 MG/2ML IJ SOLN: 4.0000 mg | Freq: Once | INTRAMUSCULAR | Status: DC | PRN

## 2015-06-24 MED ORDER — POVIDONE-IODINE 5 % OP SOLN
1.0000 "application " | OPHTHALMIC | Status: DC | PRN
  Administered 2015-06-24: 1 via OPHTHALMIC

## 2015-06-24 MED ORDER — NA HYALUR & NA CHOND-NA HYALUR 0.4-0.35 ML IO KIT
PACK | INTRAOCULAR | Status: DC | PRN
  Administered 2015-06-24: 1 mL via INTRAOCULAR

## 2015-06-24 MED ORDER — FENTANYL CITRATE (PF) 100 MCG/2ML IJ SOLN
INTRAMUSCULAR | Status: DC | PRN
  Administered 2015-06-24: 50 ug via INTRAVENOUS

## 2015-06-24 MED ORDER — ACETAMINOPHEN 325 MG PO TABS: 325.0000 mg | ORAL_TABLET | ORAL | Status: DC | PRN

## 2015-06-24 MED ORDER — EPINEPHRINE HCL 1 MG/ML IJ SOLN
INTRAOCULAR | Status: DC | PRN
  Administered 2015-06-24: 123 mL via OPHTHALMIC

## 2015-06-24 MED ORDER — ARMC OPHTHALMIC DILATING GEL
1.0000 "application " | OPHTHALMIC | Status: DC | PRN
  Administered 2015-06-24 (×2): 1 via OPHTHALMIC

## 2015-06-24 MED ORDER — LIDOCAINE HCL (PF) 4 % IJ SOLN
INTRAOCULAR | Status: DC | PRN
  Administered 2015-06-24: 1 mL via OPHTHALMIC

## 2015-06-24 MED ORDER — ACETAMINOPHEN 160 MG/5ML PO SOLN: 325.0000 mg | ORAL | Status: DC | PRN

## 2015-06-24 MED ORDER — TIMOLOL MALEATE 0.5 % OP SOLN
OPHTHALMIC | Status: DC | PRN
  Administered 2015-06-24: 1 [drp] via OPHTHALMIC

## 2015-06-24 MED ORDER — BRIMONIDINE TARTRATE 0.2 % OP SOLN
OPHTHALMIC | Status: DC | PRN
  Administered 2015-06-24: 1 [drp] via OPHTHALMIC

## 2015-06-24 MED ORDER — CEFUROXIME OPHTHALMIC INJECTION 1 MG/0.1 ML
INJECTION | OPHTHALMIC | Status: DC | PRN
  Administered 2015-06-24: 0.1 mL via OPHTHALMIC

## 2015-06-24 SURGICAL SUPPLY — 28 items

## 2015-06-24 NOTE — Anesthesia Postprocedure Evaluation (Signed)
Anesthesia Post Note  Patient: Sarah Baird  Procedure(s) Performed: Procedure(s) (LRB): CATARACT EXTRACTION PHACO AND INTRAOCULAR LENS PLACEMENT (IOC) righr eye (Right)  Patient location during evaluation: PACU Anesthesia Type: MAC Level of consciousness: awake and alert Pain management: pain level controlled Vital Signs Assessment: post-procedure vital signs reviewed and stable Respiratory status: spontaneous breathing, nonlabored ventilation, respiratory function stable and patient connected to nasal cannula oxygen Cardiovascular status: stable and blood pressure returned to baseline Anesthetic complications: no    Durene Fruitshomas,  Clifford Benninger G

## 2015-06-24 NOTE — Transfer of Care (Signed)
Immediate Anesthesia Transfer of Care Note  Patient: Sarah Baird  Procedure(s) Performed: Procedure(s) with comments: CATARACT EXTRACTION PHACO AND INTRAOCULAR LENS PLACEMENT (IOC) righr eye (Right) - DIABETIC - oral meds  Patient Location: PACU  Anesthesia Type: MAC  Level of Consciousness: awake, alert  and patient cooperative  Airway and Oxygen Therapy: Patient Spontanous Breathing and Patient connected to supplemental oxygen  Post-op Assessment: Post-op Vital signs reviewed, Patient's Cardiovascular Status Stable, Respiratory Function Stable, Patent Airway and No signs of Nausea or vomiting  Post-op Vital Signs: Reviewed and stable  Complications: No apparent anesthesia complications

## 2015-06-24 NOTE — H&P (Signed)
H+P reviewed and is up to date, please see paper chart.  

## 2015-06-24 NOTE — Anesthesia Procedure Notes (Signed)
Procedure Name: MAC Performed by: Bisma Klett Pre-anesthesia Checklist: Patient identified, Emergency Drugs available, Suction available, Timeout performed and Patient being monitored Patient Re-evaluated:Patient Re-evaluated prior to inductionOxygen Delivery Method: Nasal cannula Placement Confirmation: positive ETCO2     

## 2015-06-24 NOTE — Anesthesia Preprocedure Evaluation (Addendum)
Anesthesia Evaluation  Patient identified by MRN, date of birth, ID band  Reviewed: Allergy & Precautions, H&P , NPO status , Patient's Chart, lab work & pertinent test results  Airway Mallampati: III  TM Distance: >3 FB Neck ROM: full    Dental no notable dental hx.    Pulmonary    Pulmonary exam normal        Cardiovascular hypertension,  Rhythm:regular Rate:Normal     Neuro/Psych    GI/Hepatic   Endo/Other  diabetes  Renal/GU      Musculoskeletal   Abdominal   Peds  Hematology   Anesthesia Other Findings   Reproductive/Obstetrics                            Anesthesia Physical  Anesthesia Plan  ASA: II  Anesthesia Plan: MAC   Post-op Pain Management:    Induction:   Airway Management Planned:   Additional Equipment:   Intra-op Plan:   Post-operative Plan:   Informed Consent: I have reviewed the patients History and Physical, chart, labs and discussed the procedure including the risks, benefits and alternatives for the proposed anesthesia with the patient or authorized representative who has indicated his/her understanding and acceptance.     Plan Discussed with: CRNA  Anesthesia Plan Comments:         Anesthesia Quick Evaluation

## 2015-06-24 NOTE — Op Note (Signed)
Date of Surgery: 06/24/2015  PREOPERATIVE DIAGNOSES: Visually significant nuclear sclerotic cataract, right eye.  POSTOPERATIVE DIAGNOSES: Same  PROCEDURES PERFORMED: Cataract extraction with intraocular lens implant, right eye.  SURGEON: Devin GoingAnita P. Vin, M.D.  ANESTHESIA: MAC and topical  IMPLANTS: AcrySof IQ [id number] +22.5 delivered by AU00T0 injector   Implant Name Type Inv. Item Serial No. Manufacturer Lot No. LRB No. Used  LENS IOL ACRYSOF IQ 22.5 - [id number] Intraocular Lens LENS IOL ACRYSOF IQ 22.5 [id number] [employer]   Right 1     COMPLICATIONS: None.  DESCRIPTION OF PROCEDURE: Therapeutic options were discussed with the patient preoperatively, including a discussion of risks and benefits of surgery. Informed consent was obtained. An IOL-Master and immersion biometry were used to take the lens measurements, and a dilated fundus exam was performed within 6 months of the surgical date.  The patient was premedicated and brought to the operating room and placed on the operating table in the supine position. After adequate anesthesia, the patient was prepped and draped in the usual sterile ophthalmic fashion. A wire lid speculum was inserted and the microscope was positioned. A Superblade was used to create a paracentesis site at the limbus and a small amount of dilute preservative free lidocaine was instilled into the anterior chamber, followed by dispersive viscoelastic. A clear corneal incision was created temporally using a 2.4 mm keratome blade. Capsulorrhexis was then performed. In situ phacoemulsification was performed.  Cortical material was removed with the irrigation-aspiration unit. Dispersive viscoelastic was instilled to open the capsular bag. A posterior chamber intraocular lens with the specifications above was inserted and positioned. Irrigation-aspiration was used to remove all viscoelastic. Cefuroxime 1cc was instilled into the anterior chamber, and the corneal incision was  checked and found to be water tight. The eyelid speculum was removed.  The operative eye was covered with protective goggles after instilling 1 drop of timolol and brimonidine. The patient tolerated the procedure well. There were no complications.

## 2015-06-25 ENCOUNTER — Encounter: Payer: Self-pay | Admitting: Ophthalmology

## 2015-10-11 DIAGNOSIS — H353222 Exudative age-related macular degeneration, left eye, with inactive choroidal neovascularization: Secondary | ICD-10-CM | POA: Diagnosis not present

## 2015-11-15 DIAGNOSIS — H353222 Exudative age-related macular degeneration, left eye, with inactive choroidal neovascularization: Secondary | ICD-10-CM | POA: Diagnosis not present

## 2015-12-11 ENCOUNTER — Other Ambulatory Visit: Payer: Self-pay | Admitting: Internal Medicine

## 2015-12-11 DIAGNOSIS — Z1231 Encounter for screening mammogram for malignant neoplasm of breast: Secondary | ICD-10-CM

## 2015-12-11 DIAGNOSIS — I10 Essential (primary) hypertension: Secondary | ICD-10-CM | POA: Diagnosis not present

## 2015-12-11 DIAGNOSIS — Z79899 Other long term (current) drug therapy: Secondary | ICD-10-CM | POA: Diagnosis not present

## 2015-12-11 DIAGNOSIS — F5104 Psychophysiologic insomnia: Secondary | ICD-10-CM | POA: Diagnosis not present

## 2015-12-11 DIAGNOSIS — G63 Polyneuropathy in diseases classified elsewhere: Secondary | ICD-10-CM | POA: Diagnosis not present

## 2015-12-11 DIAGNOSIS — E782 Mixed hyperlipidemia: Secondary | ICD-10-CM | POA: Diagnosis not present

## 2015-12-11 DIAGNOSIS — E559 Vitamin D deficiency, unspecified: Secondary | ICD-10-CM | POA: Diagnosis not present

## 2015-12-11 DIAGNOSIS — R12 Heartburn: Secondary | ICD-10-CM | POA: Diagnosis not present

## 2015-12-11 DIAGNOSIS — Z23 Encounter for immunization: Secondary | ICD-10-CM | POA: Diagnosis not present

## 2015-12-11 DIAGNOSIS — Z91148 Patient's other noncompliance with medication regimen for other reason: Secondary | ICD-10-CM | POA: Insufficient documentation

## 2015-12-11 DIAGNOSIS — E1140 Type 2 diabetes mellitus with diabetic neuropathy, unspecified: Secondary | ICD-10-CM | POA: Diagnosis not present

## 2015-12-11 DIAGNOSIS — M1711 Unilateral primary osteoarthritis, right knee: Secondary | ICD-10-CM | POA: Diagnosis not present

## 2016-01-03 DIAGNOSIS — H353222 Exudative age-related macular degeneration, left eye, with inactive choroidal neovascularization: Secondary | ICD-10-CM | POA: Diagnosis not present

## 2016-01-10 DIAGNOSIS — M8589 Other specified disorders of bone density and structure, multiple sites: Secondary | ICD-10-CM | POA: Diagnosis not present

## 2016-01-10 DIAGNOSIS — I10 Essential (primary) hypertension: Secondary | ICD-10-CM | POA: Diagnosis not present

## 2016-01-10 DIAGNOSIS — E1140 Type 2 diabetes mellitus with diabetic neuropathy, unspecified: Secondary | ICD-10-CM | POA: Diagnosis not present

## 2016-01-10 DIAGNOSIS — E782 Mixed hyperlipidemia: Secondary | ICD-10-CM | POA: Diagnosis not present

## 2016-01-10 DIAGNOSIS — F5104 Psychophysiologic insomnia: Secondary | ICD-10-CM | POA: Diagnosis not present

## 2016-01-17 DIAGNOSIS — H353222 Exudative age-related macular degeneration, left eye, with inactive choroidal neovascularization: Secondary | ICD-10-CM | POA: Diagnosis not present

## 2016-02-28 DIAGNOSIS — H353222 Exudative age-related macular degeneration, left eye, with inactive choroidal neovascularization: Secondary | ICD-10-CM | POA: Diagnosis not present

## 2016-04-28 ENCOUNTER — Ambulatory Visit
Admission: EM | Admit: 2016-04-28 | Discharge: 2016-04-28 | Disposition: A | Discharge status: Home / Self Care | Payer: PPO | Attending: Family Medicine | Admitting: Family Medicine

## 2016-04-28 ENCOUNTER — Encounter: Payer: Self-pay | Admitting: *Deleted

## 2016-04-28 DIAGNOSIS — R109 Unspecified abdominal pain: Secondary | ICD-10-CM | POA: Diagnosis not present

## 2016-04-28 DIAGNOSIS — R509 Fever, unspecified: Secondary | ICD-10-CM | POA: Diagnosis not present

## 2016-04-28 DIAGNOSIS — M549 Dorsalgia, unspecified: Secondary | ICD-10-CM | POA: Diagnosis not present

## 2016-04-28 DIAGNOSIS — R4182 Altered mental status, unspecified: Secondary | ICD-10-CM | POA: Diagnosis not present

## 2016-04-28 DIAGNOSIS — R638 Other symptoms and signs concerning food and fluid intake: Secondary | ICD-10-CM

## 2016-04-28 DIAGNOSIS — E119 Type 2 diabetes mellitus without complications: Secondary | ICD-10-CM | POA: Diagnosis not present

## 2016-04-28 DIAGNOSIS — R410 Disorientation, unspecified: Secondary | ICD-10-CM | POA: Diagnosis not present

## 2016-04-28 DIAGNOSIS — R0989 Other specified symptoms and signs involving the circulatory and respiratory systems: Secondary | ICD-10-CM | POA: Diagnosis not present

## 2016-04-28 DIAGNOSIS — R05 Cough: Secondary | ICD-10-CM | POA: Diagnosis not present

## 2016-04-28 LAB — URINALYSIS, COMPLETE (UACMP) WITH MICROSCOPIC
BILIRUBIN URINE: NEGATIVE
Glucose, UA: 250 mg/dL — AB
KETONES UR: NEGATIVE mg/dL
Leukocytes, UA: NEGATIVE
NITRITE: NEGATIVE
Protein, ur: NEGATIVE mg/dL
SPECIFIC GRAVITY, URINE: 1.025 (ref 1.005–1.030)
WBC, UA: NONE SEEN WBC/hpf (ref 0–5)
pH: 5.5 (ref 5.0–8.0)

## 2016-04-28 NOTE — ED Notes (Signed)
Gave report to [place] [facility].

## 2016-04-28 NOTE — ED Triage Notes (Signed)
Patient started having back and right flank pain 2 days ago. Patient is displaying a altered mental status the last 24 hours. History of sepsis in the past.

## 2016-04-28 NOTE — ED Provider Notes (Signed)
CSN: [id number]     Arrival date & time 04/28/16  1913 History   First MD Initiated Contact with Patient 04/28/16 2031     Chief Complaint  Patient presents with  . Back Pain  . Flank Pain  . Altered Mental Status   (Consider location/radiation/quality/duration/timing/severity/associated sxs/prior Treatment) HPI  This 83 year old female who is brought in by her son. She has had back and right flank pain 2 days ago. Son states that she's been having altered mental status and disorientation for the last 24 hours. His had sepsis in the past. Son states that she's had a lot of congestion recently. She's been afebrile today pulse rate is 107 blood pressure 170/74 respirations 18 and O2 sats 94% on room air.       Past Medical History:  Diagnosis Date  . Depression   . Diabetes mellitus without complication (HCC)   . Hypercholesteremia   . Hypertension   . Osteoporosis    Past Surgical History:  Procedure Laterality Date  . ABDOMINAL HYSTERECTOMY    . APPENDECTOMY    . CATARACT EXTRACTION W/PHACO Left 06/03/2015   Procedure: CATARACT EXTRACTION PHACO AND INTRAOCULAR LENS PLACEMENT (IOC);  Surgeon: Sherald HessAnita Prakash Vin-Parikh, MD;  [facility];  Service: Ophthalmology;  Laterality: Left;  DIABETIC - oral meds keep Norva RiffleMartins together  . CATARACT EXTRACTION W/PHACO Right 06/24/2015   Procedure: CATARACT EXTRACTION PHACO AND INTRAOCULAR LENS PLACEMENT (IOC) righr eye;  Surgeon: Sherald HessAnita Prakash Vin-Parikh, MD;  [facility];  Service: Ophthalmology;  Laterality: Right;  DIABETIC - oral meds  . CHOLECYSTECTOMY     History reviewed. No pertinent family history. Social History  Substance Use Topics  . Smoking status: Never Smoker  . Smokeless tobacco: Never Used  . Alcohol use No   OB History    No data available     Review of Systems  Constitutional: Negative for activity change and chills.  HENT: Positive for congestion.   Respiratory: Positive for  cough and shortness of breath.   Psychiatric/Behavioral: Positive for confusion.  All other systems reviewed and are negative.   Allergies  Patient has no known allergies.  Home Medications   Prior to Admission medications   Medication Sig Start Date End Date Taking? Authorizing Provider  alendronate (FOSAMAX) 35 MG tablet Take 35 mg by mouth every 7 (seven) days. Take with a full glass of water on an empty stomach.   Yes Historical Provider, MD  aspirin 81 MG tablet Take 81 mg by mouth daily.   Yes Historical Provider, MD  Cholecalciferol (VITAMIN D3) 10000 UNITS TABS Take by mouth.   Yes Historical Provider, MD  Cobalamine Combinations (VITAMIN B12-FOLIC ACID) 500-400 MCG TABS Take by mouth.   Yes Historical Provider, MD  gabapentin (NEURONTIN) 300 MG capsule Take 300 mg by mouth at bedtime.   Yes Historical Provider, MD  lisinopril (PRINIVIL,ZESTRIL) 10 MG tablet Take 20 mg by mouth 2 (two) times daily.    Yes Historical Provider, MD  LORazepam (ATIVAN) 0.5 MG tablet Take 0.5 mg by mouth at bedtime.   Yes Historical Provider, MD  metFORMIN (GLUCOPHAGE-XR) 500 MG 24 hr tablet Take 500 mg by mouth 2 (two) times daily.   Yes Historical Provider, MD  nortriptyline (PAMELOR) 50 MG capsule Take 50 mg by mouth at bedtime.   Yes Historical Provider, MD  simvastatin (ZOCOR) 10 MG tablet Take 10 mg by mouth daily.   Yes Historical Provider, MD  vitamin E 1000 UNIT capsule Take 1,000  Units by mouth daily.   Yes Historical Provider, MD   Meds Ordered and Administered this Visit  Medications - No data to display  BP (!) 170/74 (BP Location: Left Arm)   Pulse (!) 107   Temp 98.6 F (37 C) (Oral)   Resp 18   Ht 5\' 6"  (1.676 m)   Wt 167 lb (75.8 kg)   SpO2 94%   BMI 26.95 kg/m  No data found.   Physical Exam  Constitutional: She appears well-developed and well-nourished. No distress.  HENT:  Head: Normocephalic and atraumatic.  Right Ear: External ear normal.  Left Ear: External ear  normal.  Nose: Nose normal.  Mouth/Throat: Oropharynx is clear and moist. No oropharyngeal exudate.  Eyes: Pupils are equal, round, and reactive to light. Right eye exhibits no discharge. Left eye exhibits no discharge.  Neck: Normal range of motion.  Pulmonary/Chest: Effort normal. She has rales.  Has  non-tussive crackles in the left lower lobe.  Musculoskeletal: Normal range of motion.  Neurological:  Is disoriented to place and time.  Skin: Skin is warm and dry. She is not diaphoretic.  Nursing note and vitals reviewed.   Urgent Care Course     Procedures (including critical care time)  Labs Review Labs Reviewed  URINALYSIS, COMPLETE (UACMP) WITH MICROSCOPIC - Abnormal; Notable for the following:       Result Value   APPearance HAZY (*)    Glucose, UA 250 (*)    Hgb urine dipstick MODERATE (*)    Squamous Epithelial / LPF 0-5 (*)    Bacteria, UA RARE (*)    All other components within normal limits    Imaging Review No results found.   Visual Acuity Review  Right Eye Distance:   Left Eye Distance:   Bilateral Distance:    Right Eye Near:   Left Eye Near:    Bilateral Near:         MDM   1. Disorientation   2. Dehydration symptoms    .After examining the patient I talked with the son and the patient and recommended that they go to higher level of care since she will need more evaluation and treatment. Decided to go to [facility] emergency department. He will transport her there by privately owned vehicle. She was stable  at the time of her discharge from our facility. Notified the charge nurse of [facility] emergency department of her departure and a brief report.    Lutricia Feil, PA-C 04/28/16 [phone]

## 2016-05-08 DIAGNOSIS — H353222 Exudative age-related macular degeneration, left eye, with inactive choroidal neovascularization: Secondary | ICD-10-CM | POA: Diagnosis not present

## 2016-05-15 DIAGNOSIS — R05 Cough: Secondary | ICD-10-CM | POA: Diagnosis not present

## 2016-05-15 DIAGNOSIS — F5104 Psychophysiologic insomnia: Secondary | ICD-10-CM | POA: Diagnosis not present

## 2016-05-15 DIAGNOSIS — I10 Essential (primary) hypertension: Secondary | ICD-10-CM | POA: Diagnosis not present

## 2016-05-15 DIAGNOSIS — R350 Frequency of micturition: Secondary | ICD-10-CM | POA: Diagnosis not present

## 2016-05-15 DIAGNOSIS — D696 Thrombocytopenia, unspecified: Secondary | ICD-10-CM | POA: Diagnosis not present

## 2016-05-15 DIAGNOSIS — M545 Low back pain: Secondary | ICD-10-CM | POA: Diagnosis not present

## 2016-05-15 DIAGNOSIS — Z87440 Personal history of urinary (tract) infections: Secondary | ICD-10-CM | POA: Diagnosis not present

## 2016-05-15 DIAGNOSIS — J309 Allergic rhinitis, unspecified: Secondary | ICD-10-CM | POA: Diagnosis not present

## 2016-06-09 DIAGNOSIS — D696 Thrombocytopenia, unspecified: Secondary | ICD-10-CM | POA: Diagnosis not present

## 2016-06-09 DIAGNOSIS — R350 Frequency of micturition: Secondary | ICD-10-CM | POA: Diagnosis not present

## 2016-06-09 DIAGNOSIS — E559 Vitamin D deficiency, unspecified: Secondary | ICD-10-CM | POA: Diagnosis not present

## 2016-06-09 DIAGNOSIS — I10 Essential (primary) hypertension: Secondary | ICD-10-CM | POA: Diagnosis not present

## 2016-06-09 DIAGNOSIS — G63 Polyneuropathy in diseases classified elsewhere: Secondary | ICD-10-CM | POA: Diagnosis not present

## 2016-06-09 DIAGNOSIS — F5104 Psychophysiologic insomnia: Secondary | ICD-10-CM | POA: Diagnosis not present

## 2016-06-09 DIAGNOSIS — E1140 Type 2 diabetes mellitus with diabetic neuropathy, unspecified: Secondary | ICD-10-CM | POA: Diagnosis not present

## 2016-06-09 DIAGNOSIS — Z79899 Other long term (current) drug therapy: Secondary | ICD-10-CM | POA: Diagnosis not present

## 2016-06-09 DIAGNOSIS — E782 Mixed hyperlipidemia: Secondary | ICD-10-CM | POA: Diagnosis not present

## 2016-06-14 DIAGNOSIS — R918 Other nonspecific abnormal finding of lung field: Secondary | ICD-10-CM | POA: Diagnosis not present

## 2016-06-14 DIAGNOSIS — R5383 Other fatigue: Secondary | ICD-10-CM | POA: Diagnosis not present

## 2016-06-14 DIAGNOSIS — I10 Essential (primary) hypertension: Secondary | ICD-10-CM | POA: Diagnosis not present

## 2016-06-14 DIAGNOSIS — R05 Cough: Secondary | ICD-10-CM | POA: Diagnosis not present

## 2016-06-14 DIAGNOSIS — E119 Type 2 diabetes mellitus without complications: Secondary | ICD-10-CM | POA: Diagnosis not present

## 2016-06-14 DIAGNOSIS — E860 Dehydration: Secondary | ICD-10-CM | POA: Diagnosis not present

## 2016-06-14 DIAGNOSIS — R4182 Altered mental status, unspecified: Secondary | ICD-10-CM | POA: Diagnosis not present

## 2016-06-14 DIAGNOSIS — J9811 Atelectasis: Secondary | ICD-10-CM | POA: Diagnosis not present

## 2016-06-14 DIAGNOSIS — R531 Weakness: Secondary | ICD-10-CM | POA: Diagnosis not present

## 2016-06-14 DIAGNOSIS — Z87440 Personal history of urinary (tract) infections: Secondary | ICD-10-CM | POA: Diagnosis not present

## 2016-06-16 DIAGNOSIS — R6889 Other general symptoms and signs: Secondary | ICD-10-CM | POA: Diagnosis not present

## 2016-06-16 DIAGNOSIS — R4182 Altered mental status, unspecified: Secondary | ICD-10-CM | POA: Diagnosis not present

## 2016-06-16 DIAGNOSIS — J9811 Atelectasis: Secondary | ICD-10-CM | POA: Diagnosis not present

## 2016-06-16 DIAGNOSIS — Z9049 Acquired absence of other specified parts of digestive tract: Secondary | ICD-10-CM | POA: Diagnosis not present

## 2016-06-16 DIAGNOSIS — H9190 Unspecified hearing loss, unspecified ear: Secondary | ICD-10-CM | POA: Diagnosis not present

## 2016-06-16 DIAGNOSIS — I440 Atrioventricular block, first degree: Secondary | ICD-10-CM | POA: Diagnosis not present

## 2016-06-16 DIAGNOSIS — K219 Gastro-esophageal reflux disease without esophagitis: Secondary | ICD-10-CM | POA: Insufficient documentation

## 2016-06-16 DIAGNOSIS — I5031 Acute diastolic (congestive) heart failure: Secondary | ICD-10-CM | POA: Diagnosis not present

## 2016-06-16 DIAGNOSIS — K838 Other specified diseases of biliary tract: Secondary | ICD-10-CM | POA: Diagnosis not present

## 2016-06-16 DIAGNOSIS — I444 Left anterior fascicular block: Secondary | ICD-10-CM | POA: Diagnosis not present

## 2016-06-16 DIAGNOSIS — M47819 Spondylosis without myelopathy or radiculopathy, site unspecified: Secondary | ICD-10-CM | POA: Diagnosis not present

## 2016-06-16 DIAGNOSIS — K5732 Diverticulitis of large intestine without perforation or abscess without bleeding: Secondary | ICD-10-CM | POA: Diagnosis not present

## 2016-06-16 DIAGNOSIS — E119 Type 2 diabetes mellitus without complications: Secondary | ICD-10-CM | POA: Diagnosis not present

## 2016-06-16 DIAGNOSIS — F329 Major depressive disorder, single episode, unspecified: Secondary | ICD-10-CM | POA: Diagnosis not present

## 2016-06-16 DIAGNOSIS — K5792 Diverticulitis of intestine, part unspecified, without perforation or abscess without bleeding: Secondary | ICD-10-CM | POA: Insufficient documentation

## 2016-06-16 DIAGNOSIS — N281 Cyst of kidney, acquired: Secondary | ICD-10-CM | POA: Diagnosis not present

## 2016-06-16 DIAGNOSIS — R911 Solitary pulmonary nodule: Secondary | ICD-10-CM | POA: Diagnosis not present

## 2016-06-16 DIAGNOSIS — Z7982 Long term (current) use of aspirin: Secondary | ICD-10-CM | POA: Diagnosis not present

## 2016-06-16 DIAGNOSIS — I110 Hypertensive heart disease with heart failure: Secondary | ICD-10-CM | POA: Diagnosis not present

## 2016-06-16 DIAGNOSIS — Z7984 Long term (current) use of oral hypoglycemic drugs: Secondary | ICD-10-CM | POA: Diagnosis not present

## 2016-06-16 DIAGNOSIS — Z79899 Other long term (current) drug therapy: Secondary | ICD-10-CM | POA: Diagnosis not present

## 2016-06-16 DIAGNOSIS — E785 Hyperlipidemia, unspecified: Secondary | ICD-10-CM | POA: Diagnosis not present

## 2016-06-16 DIAGNOSIS — R0989 Other specified symptoms and signs involving the circulatory and respiratory systems: Secondary | ICD-10-CM | POA: Diagnosis not present

## 2016-06-16 DIAGNOSIS — R410 Disorientation, unspecified: Secondary | ICD-10-CM | POA: Diagnosis not present

## 2016-06-16 DIAGNOSIS — R4781 Slurred speech: Secondary | ICD-10-CM | POA: Diagnosis not present

## 2016-06-16 DIAGNOSIS — R918 Other nonspecific abnormal finding of lung field: Secondary | ICD-10-CM | POA: Diagnosis not present

## 2016-06-17 DIAGNOSIS — R4182 Altered mental status, unspecified: Secondary | ICD-10-CM | POA: Diagnosis not present

## 2016-06-17 DIAGNOSIS — J811 Chronic pulmonary edema: Secondary | ICD-10-CM | POA: Diagnosis not present

## 2016-06-17 DIAGNOSIS — E119 Type 2 diabetes mellitus without complications: Secondary | ICD-10-CM | POA: Diagnosis not present

## 2016-06-17 DIAGNOSIS — I700 Atherosclerosis of aorta: Secondary | ICD-10-CM | POA: Diagnosis not present

## 2016-06-17 DIAGNOSIS — K5732 Diverticulitis of large intestine without perforation or abscess without bleeding: Secondary | ICD-10-CM | POA: Diagnosis not present

## 2016-06-17 DIAGNOSIS — I517 Cardiomegaly: Secondary | ICD-10-CM | POA: Diagnosis not present

## 2016-06-18 DIAGNOSIS — K5732 Diverticulitis of large intestine without perforation or abscess without bleeding: Secondary | ICD-10-CM | POA: Diagnosis not present

## 2016-06-18 DIAGNOSIS — R410 Disorientation, unspecified: Secondary | ICD-10-CM | POA: Diagnosis not present

## 2016-06-18 DIAGNOSIS — I5031 Acute diastolic (congestive) heart failure: Secondary | ICD-10-CM | POA: Diagnosis not present

## 2016-07-09 DIAGNOSIS — E1140 Type 2 diabetes mellitus with diabetic neuropathy, unspecified: Secondary | ICD-10-CM | POA: Diagnosis not present

## 2016-07-09 DIAGNOSIS — F329 Major depressive disorder, single episode, unspecified: Secondary | ICD-10-CM | POA: Diagnosis not present

## 2016-07-09 DIAGNOSIS — F419 Anxiety disorder, unspecified: Secondary | ICD-10-CM | POA: Diagnosis not present

## 2016-07-09 DIAGNOSIS — E8809 Other disorders of plasma-protein metabolism, not elsewhere classified: Secondary | ICD-10-CM | POA: Diagnosis not present

## 2016-07-09 DIAGNOSIS — F5104 Psychophysiologic insomnia: Secondary | ICD-10-CM | POA: Diagnosis not present

## 2016-07-09 DIAGNOSIS — G63 Polyneuropathy in diseases classified elsewhere: Secondary | ICD-10-CM | POA: Diagnosis not present

## 2016-07-09 DIAGNOSIS — I10 Essential (primary) hypertension: Secondary | ICD-10-CM | POA: Diagnosis not present

## 2016-07-09 DIAGNOSIS — I519 Heart disease, unspecified: Secondary | ICD-10-CM | POA: Diagnosis not present

## 2016-07-09 DIAGNOSIS — I5189 Other ill-defined heart diseases: Secondary | ICD-10-CM | POA: Insufficient documentation

## 2016-07-15 DIAGNOSIS — I10 Essential (primary) hypertension: Secondary | ICD-10-CM | POA: Diagnosis not present

## 2016-07-15 DIAGNOSIS — E1140 Type 2 diabetes mellitus with diabetic neuropathy, unspecified: Secondary | ICD-10-CM | POA: Diagnosis not present

## 2016-07-15 DIAGNOSIS — E782 Mixed hyperlipidemia: Secondary | ICD-10-CM | POA: Diagnosis not present

## 2016-07-20 DIAGNOSIS — H353112 Nonexudative age-related macular degeneration, right eye, intermediate dry stage: Secondary | ICD-10-CM | POA: Diagnosis not present

## 2016-07-20 DIAGNOSIS — H353222 Exudative age-related macular degeneration, left eye, with inactive choroidal neovascularization: Secondary | ICD-10-CM | POA: Diagnosis not present

## 2016-10-02 DIAGNOSIS — G3184 Mild cognitive impairment, so stated: Secondary | ICD-10-CM | POA: Diagnosis not present

## 2016-10-02 DIAGNOSIS — Z79899 Other long term (current) drug therapy: Secondary | ICD-10-CM | POA: Diagnosis not present

## 2016-10-02 DIAGNOSIS — E782 Mixed hyperlipidemia: Secondary | ICD-10-CM | POA: Diagnosis not present

## 2016-10-02 DIAGNOSIS — D696 Thrombocytopenia, unspecified: Secondary | ICD-10-CM | POA: Diagnosis not present

## 2016-10-02 DIAGNOSIS — I10 Essential (primary) hypertension: Secondary | ICD-10-CM | POA: Diagnosis not present

## 2016-10-02 DIAGNOSIS — E1140 Type 2 diabetes mellitus with diabetic neuropathy, unspecified: Secondary | ICD-10-CM | POA: Diagnosis not present

## 2016-10-02 DIAGNOSIS — I519 Heart disease, unspecified: Secondary | ICD-10-CM | POA: Diagnosis not present

## 2016-10-02 DIAGNOSIS — F5104 Psychophysiologic insomnia: Secondary | ICD-10-CM | POA: Diagnosis not present

## 2016-10-02 DIAGNOSIS — R300 Dysuria: Secondary | ICD-10-CM | POA: Diagnosis not present

## 2016-10-02 DIAGNOSIS — G63 Polyneuropathy in diseases classified elsewhere: Secondary | ICD-10-CM | POA: Diagnosis not present

## 2016-10-19 DIAGNOSIS — H353112 Nonexudative age-related macular degeneration, right eye, intermediate dry stage: Secondary | ICD-10-CM | POA: Diagnosis not present

## 2016-12-21 DIAGNOSIS — H353222 Exudative age-related macular degeneration, left eye, with inactive choroidal neovascularization: Secondary | ICD-10-CM | POA: Diagnosis not present

## 2016-12-21 DIAGNOSIS — H353112 Nonexudative age-related macular degeneration, right eye, intermediate dry stage: Secondary | ICD-10-CM | POA: Diagnosis not present

## 2017-01-04 DIAGNOSIS — E559 Vitamin D deficiency, unspecified: Secondary | ICD-10-CM | POA: Diagnosis not present

## 2017-01-04 DIAGNOSIS — E782 Mixed hyperlipidemia: Secondary | ICD-10-CM | POA: Diagnosis not present

## 2017-01-04 DIAGNOSIS — I519 Heart disease, unspecified: Secondary | ICD-10-CM | POA: Diagnosis not present

## 2017-01-04 DIAGNOSIS — Z23 Encounter for immunization: Secondary | ICD-10-CM | POA: Diagnosis not present

## 2017-01-04 DIAGNOSIS — Z79899 Other long term (current) drug therapy: Secondary | ICD-10-CM | POA: Diagnosis not present

## 2017-01-04 DIAGNOSIS — Z0000 Encounter for general adult medical examination without abnormal findings: Secondary | ICD-10-CM | POA: Diagnosis not present

## 2017-01-04 DIAGNOSIS — I10 Essential (primary) hypertension: Secondary | ICD-10-CM | POA: Diagnosis not present

## 2017-01-04 DIAGNOSIS — G3184 Mild cognitive impairment, so stated: Secondary | ICD-10-CM | POA: Diagnosis not present

## 2017-01-04 DIAGNOSIS — E1140 Type 2 diabetes mellitus with diabetic neuropathy, unspecified: Secondary | ICD-10-CM | POA: Diagnosis not present

## 2017-01-04 DIAGNOSIS — D696 Thrombocytopenia, unspecified: Secondary | ICD-10-CM | POA: Diagnosis not present

## 2017-01-19 DIAGNOSIS — E782 Mixed hyperlipidemia: Secondary | ICD-10-CM | POA: Diagnosis not present

## 2017-01-19 DIAGNOSIS — E1140 Type 2 diabetes mellitus with diabetic neuropathy, unspecified: Secondary | ICD-10-CM | POA: Diagnosis not present

## 2017-01-19 DIAGNOSIS — I519 Heart disease, unspecified: Secondary | ICD-10-CM | POA: Diagnosis not present

## 2017-01-19 DIAGNOSIS — I10 Essential (primary) hypertension: Secondary | ICD-10-CM | POA: Diagnosis not present

## 2018-02-23 ENCOUNTER — Other Ambulatory Visit: Payer: Self-pay

## 2018-02-23 DIAGNOSIS — E1159 Type 2 diabetes mellitus with other circulatory complications: Secondary | ICD-10-CM | POA: Insufficient documentation

## 2018-02-23 DIAGNOSIS — I10 Essential (primary) hypertension: Secondary | ICD-10-CM

## 2018-02-24 ENCOUNTER — Encounter: Payer: Self-pay | Admitting: Gastroenterology

## 2018-02-24 ENCOUNTER — Encounter

## 2018-02-24 ENCOUNTER — Ambulatory Visit: Payer: Medicare Other | Admitting: Gastroenterology

## 2018-02-24 VITALS — BP 191/67 | HR 66 | Ht 64.0 in | Wt 146.8 lb

## 2018-02-24 DIAGNOSIS — R110 Nausea: Secondary | ICD-10-CM | POA: Diagnosis not present

## 2018-02-24 NOTE — Progress Notes (Signed)
Gastroenterology Consultation  Referring Provider:     Mickey Farberhies, David, MD Primary Care Physician:  Mickey Farberhies, David, MD Primary Gastroenterologist:  Dr. Servando SnareWohl     Reason for Consultation:     Nausea        HPI:   Sarah MartinsRuth A Baird is a 85 y.o. y/o female referred for consultation & management of Nausea by Dr. Mickey Farberhies, David, MD.  This patient comes in today with her son who is a patient of mine for chronic nausea.  The patient reports that the nausea started right before Thanksgiving.  She had been feeling much worse but has been taking omeprazole 20 mg in the morning which she thinks helps a little and she was taking Phenergan.  She continues to have the nausea and states that the nausea is mostly in the morning and not as prevalent during the day.  She has lost some weight due to the nausea and inability to eat because the nausea.  There is no report of any dysphagia black stools or bloody stools.  There is also no report of any personal history of colon cancer colon polyps.  The patient does not recall when she had her last colonoscopy.  Prior to this the patient reports that she had been in her usual state of health without any problems.  Past Medical History:  Diagnosis Date  . Depression   . Diabetes mellitus without complication (HCC)   . Hypercholesteremia   . Hypertension   . Osteoporosis     Past Surgical History:  Procedure Laterality Date  . ABDOMINAL HYSTERECTOMY    . APPENDECTOMY    . CATARACT EXTRACTION W/PHACO Left 06/03/2015   Procedure: CATARACT EXTRACTION PHACO AND INTRAOCULAR LENS PLACEMENT (IOC);  Surgeon: Sherald HessAnita Prakash Vin-Parikh, MD;  [facility];  Service: Ophthalmology;  Laterality: Left;  DIABETIC - oral meds keep Norva RiffleMartins together  . CATARACT EXTRACTION W/PHACO Right 06/24/2015   Procedure: CATARACT EXTRACTION PHACO AND INTRAOCULAR LENS PLACEMENT (IOC) righr eye;  Surgeon: Sherald HessAnita Prakash Vin-Parikh, MD;  [facility];  Service:  Ophthalmology;  Laterality: Right;  DIABETIC - oral meds  . CHOLECYSTECTOMY      Prior to Admission medications   Medication Sig Start Date End Date Taking? Authorizing Provider  acetaminophen (TYLENOL) 325 MG tablet Take by mouth.   Yes [provider]  alendronate (FOSAMAX) 35 MG tablet Take 35 mg by mouth every 7 (seven) days. Take with a full glass of water on an empty stomach.   Yes [provider]  aspirin 81 MG tablet Take 81 mg by mouth daily.   Yes [provider]  Cholecalciferol (VITAMIN D3) 10000 UNITS TABS Take by mouth.   Yes [provider]  Cobalamine Combinations (VITAMIN B12-FOLIC ACID) 500-400 MCG TABS Take by mouth.   Yes [provider]  fluticasone (FLONASE) 50 MCG/ACT nasal spray Place into the nose.   Yes [provider]  furosemide (LASIX) 20 MG tablet Take by mouth. 06/18/16  Yes [provider]  gabapentin (NEURONTIN) 300 MG capsule Take 300 mg by mouth at bedtime.   Yes [provider]  glucose blood (PRECISION QID TEST) test strip Use once daily. Use as instructed. 06/12/16  Yes [provider]  hydrochlorothiazide (HYDRODIURIL) 12.5 MG tablet Take by mouth. 02/14/18  Yes [provider]  lisinopril (PRINIVIL,ZESTRIL) 10 MG tablet Take 20 mg by mouth 2 (two) times daily.    Yes [provider]  LORazepam (ATIVAN) 0.5 MG tablet  Take 0.5 mg by mouth at bedtime.   Yes [provider]  Melatonin 3 MG TABS Take by mouth. 06/18/16  Yes [provider]  metoprolol succinate (TOPROL-XL) 25 MG 24 hr tablet Take by mouth. 08/16/17  Yes [provider]  nortriptyline (PAMELOR) 50 MG capsule Take 50 mg by mouth at bedtime.   Yes [provider]  omeprazole (PRILOSEC) 20 MG capsule Take by mouth. 02/14/18  Yes [provider]  promethazine (PHENERGAN) 12.5 MG tablet  12/01/17  Yes [provider]  sertraline (ZOLOFT) 25 MG tablet  Take by mouth. 02/14/18  Yes [provider]  simvastatin (ZOCOR) 10 MG tablet Take 10 mg by mouth daily.   Yes [provider]  traZODone (DESYREL) 50 MG tablet Take by mouth. 11/03/17 11/03/18 Yes [provider]  metFORMIN (GLUCOPHAGE-XR) 500 MG 24 hr tablet Take 500 mg by mouth 2 (two) times daily.    [provider]  vitamin E 1000 UNIT capsule Take 1,000 Units by mouth daily.    [provider]    History reviewed. No pertinent family history.   Social History   Tobacco Use  . Smoking status: Never Smoker  . Smokeless tobacco: Never Used  Substance Use Topics  . Alcohol use: No  . Drug use: No    Allergies as of 02/24/2018  . (No Known Allergies)    Review of Systems:    All systems reviewed and negative except where noted in HPI.   Physical Exam:  BP (!) 191/67   Pulse 66   Ht 5\' 4"  (1.626 m)   Wt 146 lb 12.8 oz (66.6 kg)   BMI 25.20 kg/m  No LMP recorded. Patient has had a hysterectomy. General:   Alert,  Well-developed, well-nourished, pleasant and cooperative in NAD Head:  Normocephalic and atraumatic. Eyes:  Sclera clear, no icterus.   Conjunctiva pink. Ears:  Normal auditory acuity. Nose:  No deformity, discharge, or lesions. Mouth:  No deformity or lesions,oropharynx pink & moist. Neck:  Supple; no masses or thyromegaly. Lungs:  Respirations even and unlabored.  Clear throughout to auscultation.   No wheezes, crackles, or rhonchi. No acute distress. Heart:  Regular rate and rhythm; no murmurs, clicks, rubs, or gallops. Abdomen:  Normal bowel sounds.  No bruits.  Soft, non-tender and non-distended without masses, hepatosplenomegaly or hernias noted.  No guarding or rebound tenderness.  Negative Carnett sign.   Rectal:  Deferred.  Msk:  Symmetrical without gross deformities.  Good, equal movement & strength bilaterally. Pulses:  Normal pulses noted. Extremities:  No clubbing or edema.  No cyanosis. Neurologic:  Alert  and oriented x3;  grossly normal neurologically. Skin:  Intact without significant lesions or rashes.  No jaundice. Lymph Nodes:  No significant cervical adenopathy. Psych:  Alert and cooperative. Normal mood and affect.  Imaging Studies: No results found.  Assessment and Plan:   Sarah Baird is a 85 y.o. y/o female who comes in today with a history of nausea since November.  The patient has had a some weight loss due to the nausea.  The nausea is most commonly presenting itself in the morning when she wakes up. There is no associated diarrhea or constipation with it.  She also does not have any vomiting with the nausea.  The patient has been told that she may be having nocturnal acid reflux even though she may not have heartburn causing her to have the nausea in the morning.  The patient has  been given samples of Dexilant to be taken in the evening before she goes to sleep so that she will have maximum acid suppression while she is supine.  The patient and her son have been explained the plan and have been told that if the symptoms do not improve she may need an upper endoscopy.  If the symptoms do not improve then she can be put on Dexilant or proton ask in the evening.  They have been explained the plan and agree with it.  Midge Miniumarren Bassem Bernasconi, MD. Clementeen GrahamFACG    Note: This dictation was prepared with Dragon dictation along with smaller phrase technology. Any transcriptional errors that result from this process are unintentional.

## 2018-05-10 DIAGNOSIS — H353222 Exudative age-related macular degeneration, left eye, with inactive choroidal neovascularization: Secondary | ICD-10-CM | POA: Insufficient documentation

## 2018-11-17 ENCOUNTER — Encounter: Payer: Self-pay | Admitting: Emergency Medicine

## 2018-11-17 ENCOUNTER — Ambulatory Visit
Admission: EM | Admit: 2018-11-17 | Discharge: 2018-11-17 | Disposition: A | Discharge status: Home / Self Care | Payer: Medicare Other | Attending: Family Medicine | Admitting: Family Medicine

## 2018-11-17 ENCOUNTER — Other Ambulatory Visit: Payer: Self-pay

## 2018-11-17 DIAGNOSIS — S61216A Laceration without foreign body of right little finger without damage to nail, initial encounter: Secondary | ICD-10-CM | POA: Diagnosis not present

## 2018-11-17 DIAGNOSIS — Z23 Encounter for immunization: Secondary | ICD-10-CM

## 2018-11-17 DIAGNOSIS — W268XXA Contact with other sharp object(s), not elsewhere classified, initial encounter: Secondary | ICD-10-CM | POA: Diagnosis not present

## 2018-11-17 MED ORDER — TETANUS-DIPHTH-ACELL PERTUSSIS 5-2.5-18.5 LF-MCG/0.5 IM SUSP
0.5000 mL | Freq: Once | INTRAMUSCULAR | Status: AC
  Administered 2018-11-17: 0.5 mL via INTRAMUSCULAR

## 2018-11-17 NOTE — ED Triage Notes (Signed)
Patient in today after cutting her right 5th finger while opening a can of dog food. Patient's last tetanus is unknown.

## 2018-11-17 NOTE — ED Provider Notes (Signed)
[facility]    CSN: [id number] Arrival date & time: 11/17/18  1045  History   Chief Complaint Chief Complaint  Patient presents with  . finger laceration    right 5th finger   HPI  85 year old female presents with a laceration to her right fifth digit.  Patient states that she cut her finger this morning while opening a can of dog food.  Right 5th digit - lateral aspect. She states that it bled a lot.  Bleeding is currently well controlled.  She has a dressing on the wound.  Last tetanus was in 2002.  Denies pain at this time.  She states that she got concerned given the amount of bleeding.  She is otherwise feeling well.  No other associated symptoms.  No other complaints.  PMH, Surgical Hx, Family Hx, Social History reviewed and updated as below.  Past Medical History:  Diagnosis Date  . Depression   . Diabetes mellitus without complication (HCC)   . Hypercholesteremia   . Hypertension   . Osteoporosis    Patient Active Problem List   Diagnosis Date Noted  . Hypertension associated with type 2 diabetes mellitus (HCC) 02/23/2018  . Mild cognitive impairment 10/02/2016  . Diastolic dysfunction 07/09/2016  . Confusion 06/16/2016  . Diverticulitis 06/16/2016  . GERD (gastroesophageal reflux disease) 06/16/2016  . Type 2 diabetes mellitus (HCC) 11/24/2013  . Chronic insomnia 11/24/2013  . Anxiety and depression 11/24/2013  . Heartburn 11/24/2013  . Mixed diabetic hyperlipidemia associated with type 2 diabetes mellitus (HCC) 11/24/2013  . Osteopenia 11/24/2013  . Peripheral neuropathy 11/24/2013  . Vitamin D deficiency 11/24/2013   Past Surgical History:  Procedure Laterality Date  . ABDOMINAL HYSTERECTOMY    . APPENDECTOMY    . CATARACT EXTRACTION W/PHACO Left 06/03/2015   Procedure: CATARACT EXTRACTION PHACO AND INTRAOCULAR LENS PLACEMENT (IOC);  Surgeon: Sherald Hess, MD;  [facility];  Service: Ophthalmology;  Laterality:  Left;  DIABETIC - oral meds keep Norva Riffle together  . CATARACT EXTRACTION W/PHACO Right 06/24/2015   Procedure: CATARACT EXTRACTION PHACO AND INTRAOCULAR LENS PLACEMENT (IOC) righr eye;  Surgeon: Sherald Hess, MD;  Location: [facility];  Service: Ophthalmology;  Laterality: Right;  DIABETIC - oral meds  . CHOLECYSTECTOMY      OB History   No obstetric history on file.      Home Medications    Prior to Admission medications   Medication Sig Start Date End Date Taking? Authorizing Provider  acetaminophen (TYLENOL) 325 MG tablet Take by mouth.   Yes [provider]  aspirin 81 MG tablet Take 81 mg by mouth daily.   Yes [provider]  Cholecalciferol (VITAMIN D3) 10000 UNITS TABS Take by mouth.   Yes [provider]  Cobalamine Combinations (VITAMIN B12-FOLIC ACID) 500-400 MCG TABS Take by mouth.   Yes [provider]  fluticasone (FLONASE) 50 MCG/ACT nasal spray Place into the nose.   Yes [provider]  furosemide (LASIX) 20 MG tablet Take by mouth. 06/18/16  Yes [provider]  gabapentin (NEURONTIN) 300 MG capsule Take 300 mg by mouth at bedtime.   Yes [provider]  hydrochlorothiazide (HYDRODIURIL) 12.5 MG tablet Take by mouth. 02/14/18  Yes [provider]  lisinopril (PRINIVIL,ZESTRIL) 10 MG tablet Take 20 mg by mouth 2 (two) times daily.    Yes [provider]  metoprolol succinate (TOPROL-XL) 25 MG 24 hr tablet Take by mouth. 08/16/17  Yes [provider]  omeprazole (PRILOSEC) 20 MG capsule Take by mouth. 02/14/18  Yes [provider]  sertraline (ZOLOFT) 25 MG tablet Take by mouth. 02/14/18  Yes [provider]  simvastatin (ZOCOR) 10 MG tablet Take 10 mg by mouth daily.   Yes [provider]  glucose blood (PRECISION QID TEST) test strip Use once daily. Use as instructed. 06/12/16   [provider]  metFORMIN (GLUCOPHAGE-XR) 500 MG 24  hr tablet Take 500 mg by mouth 2 (two) times daily.  11/17/18  [provider]  nortriptyline (PAMELOR) 50 MG capsule Take 50 mg by mouth at bedtime.  11/17/18  [provider]  promethazine (PHENERGAN) 12.5 MG tablet  12/01/17 11/17/18  [provider]  traZODone (DESYREL) 50 MG tablet Take by mouth. 11/03/17 11/17/18  [provider]    Family History Family History  Problem Relation Age of Onset  . Heart disease Mother   . Hypertension Mother   . Hypertension Father     Social History Social History   Tobacco Use  . Smoking status: Never Smoker  . Smokeless tobacco: Never Used  Substance Use Topics  . Alcohol use: No  . Drug use: No     Allergies   Patient has no known allergies.   Review of Systems Review of Systems  Constitutional: Negative.   Skin: Positive for wound.   Physical Exam Triage Vital Signs ED Triage Vitals  Enc Vitals Group     BP 11/17/18 1108 (!) 153/58     Pulse Rate 11/17/18 1108 (!) 47     Resp 11/17/18 1108 18     Temp 11/17/18 1108 98.1 F (36.7 C)     Temp Source 11/17/18 1108 Oral     SpO2 11/17/18 1108 100 %     Weight 11/17/18 1102 156 lb (70.8 kg)     Height 11/17/18 1102 5\' 6"  (1.676 m)     Head Circumference --      Peak Flow --      Pain Score 11/17/18 1101 0     Pain Loc --      Pain Edu? --      Excl. in [place]? --    Updated Vital Signs BP (!) 153/58 (BP Location: Left Arm)   Pulse (!) 47   Temp 98.1 F (36.7 C) (Oral)   Resp 18   Ht 5\' 6"  (1.676 m)   Wt 70.8 kg   SpO2 100%   BMI 25.18 kg/m   Visual Acuity Right Eye Distance:   Left Eye Distance:   Bilateral Distance:    Right Eye Near:   Left Eye Near:    Bilateral Near:     Physical Exam Vitals signs and nursing note reviewed.  Constitutional:      General: She is not in acute distress.    Appearance: Normal appearance. She is not ill-appearing.  HENT:     Head: Normocephalic and atraumatic.  Eyes:     General:         Right eye: No discharge.        Left eye: No discharge.     Conjunctiva/sclera: Conjunctivae normal.  Cardiovascular:     Rate and Rhythm: Regular rhythm. Bradycardia present.  Pulmonary:     Effort: Pulmonary effort is normal.     Breath sounds: Normal breath sounds. No wheezing, rhonchi or rales.  Skin:    Comments: Superficial, 1 cm laceration noted on the ulnar aspect of the distal right fifth digit at  the level of the DIP joint.  Neurological:     General: No focal deficit present.     Mental Status: She is alert and oriented to person, place, and time.  Psychiatric:        Mood and Affect: Mood normal.        Behavior: Behavior normal.    UC Treatments / Results  Labs (all labs ordered are listed, but only abnormal results are displayed) Labs Reviewed - No data to display  EKG   Radiology No results found.  Procedures Laceration Repair  Date/Time: 11/17/2018 11:33 AM Performed by: Tommie Samsook, Asyria Kolander G, DO Authorized by: Tommie Samsook, Anae Hams G, DO   Consent:    Consent obtained:  Verbal   Consent given by:  Patient Anesthesia (see MAR for exact dosages):    Anesthesia method:  None Laceration details:    Location:  Finger   Finger location:  R small finger   Length (cm):  1 Repair type:    Repair type:  Simple Exploration:    Hemostasis achieved with:  Direct pressure Treatment:    Wound cleansed with: Alcohol.  Skin repair:    Repair method:  Tissue adhesive Approximation:    Approximation:  Close Post-procedure details:    Dressing:  Bulky dressing   Patient tolerance of procedure:  Tolerated well, no immediate complications   (including critical care time)  Medications Ordered in UC Medications  Tdap (BOOSTRIX) injection 0.5 mL (0.5 mLs Intramuscular Given 11/17/18 1117)    Initial Impression / Assessment and Plan / UC Course  I have reviewed the triage vital signs and the nursing notes.  Pertinent labs & imaging results that were available during my  care of the patient were reviewed by me and considered in my medical decision making (see chart for details).    85 year old female presents with laceration.  Closed with Dermabond.  Tetanus given.  Supportive care.  Final Clinical Impressions(s) / UC Diagnoses   Final diagnoses:  Laceration of right little finger without foreign body without damage to nail, initial encounter     Discharge Instructions     Try not to open the wound.  Okay to get wet.  Glue will slowly fall off.  Take care  Dr. Adriana Simasook    ED Prescriptions    None     PDMP not reviewed this encounter.   Tommie SamsCook, Kameela Leipold G, DO 11/17/18 1134

## 2018-11-17 NOTE — Discharge Instructions (Signed)
Try not to open the wound.  Okay to get wet.  Glue will slowly fall off.  Take care  Dr. Lacinda Axon

## 2019-04-11 DIAGNOSIS — I739 Peripheral vascular disease, unspecified: Secondary | ICD-10-CM | POA: Insufficient documentation

## 2019-05-12 DIAGNOSIS — N183 Chronic kidney disease, stage 3 unspecified: Secondary | ICD-10-CM | POA: Insufficient documentation

## 2019-07-05 DIAGNOSIS — I358 Other nonrheumatic aortic valve disorders: Secondary | ICD-10-CM | POA: Insufficient documentation

## 2020-08-20 ENCOUNTER — Other Ambulatory Visit: Payer: Self-pay

## 2020-08-20 ENCOUNTER — Ambulatory Visit
Admission: EM | Admit: 2020-08-20 | Discharge: 2020-08-20 | Disposition: A | Discharge status: Home / Self Care | Payer: Medicare Other | Attending: Family Medicine | Admitting: Family Medicine

## 2020-08-20 ENCOUNTER — Encounter: Payer: Self-pay | Admitting: Emergency Medicine

## 2020-08-20 DIAGNOSIS — R410 Disorientation, unspecified: Secondary | ICD-10-CM | POA: Diagnosis not present

## 2020-08-20 DIAGNOSIS — R350 Frequency of micturition: Secondary | ICD-10-CM | POA: Insufficient documentation

## 2020-08-20 DIAGNOSIS — U071 COVID-19: Secondary | ICD-10-CM | POA: Insufficient documentation

## 2020-08-20 LAB — URINALYSIS, COMPLETE (UACMP) WITH MICROSCOPIC
Bilirubin Urine: NEGATIVE
Glucose, UA: NEGATIVE mg/dL
Hgb urine dipstick: NEGATIVE
Ketones, ur: NEGATIVE mg/dL
Leukocytes,Ua: NEGATIVE
Nitrite: NEGATIVE
Specific Gravity, Urine: 1.02 (ref 1.005–1.030)
pH: 7 (ref 5.0–8.0)

## 2020-08-20 MED ORDER — NIRMATRELVIR/RITONAVIR (PAXLOVID)TABLET: 3.0000 | ORAL_TABLET | Freq: Two times a day (BID) | ORAL | 0 refills | Status: AC

## 2020-08-20 NOTE — Discharge Instructions (Addendum)
Rest.  Keep a close eye on her. If she worsens, take her to the ER or Call EMS.  She needs to hold the statin (simvastin). Start the Paxlovid 12 hours after the last dose and do not resume it until treatment is complete.

## 2020-08-20 NOTE — ED Provider Notes (Signed)
[facility]    CSN: [id number] Arrival date & time: 08/20/20  0901      History   Chief Complaint Chief Complaint  Patient presents with   Covid Positive   Weakness    HPI 87 year old female presents for evaluation of the above.  Patient is accompanied by her son today.  Son states that for the past 3 days she has had runny nose and cough.  Patient is in agreement.  He reports that he performed a home COVID test and it was positive today.  He states that her spouse reported confusion last night and urinary frequency.  He also reports that she has been complaining of nausea.  Patient essentially has no complaints at this time.  She states that she is feeling well.  When asked to further clarify reported confusion, patient's son states that she seemed to be not answering questions appropriately to her husband.  Patient is alert and conversant currently.  Denies pain.  No reports of fever.  Son concerned about the possibility of UTI as well.  No other complaints or concerns at this time.  Past Medical History:  Diagnosis Date   Depression    Diabetes mellitus without complication (HCC)    Hypercholesteremia    Hypertension    Osteoporosis     Patient Active Problem List   Diagnosis Date Noted   Hypertension associated with type 2 diabetes mellitus (HCC) 02/23/2018   Mild cognitive impairment 10/02/2016   Diastolic dysfunction 07/09/2016   Confusion 06/16/2016   Diverticulitis 06/16/2016   GERD (gastroesophageal reflux disease) 06/16/2016   Type 2 diabetes mellitus (HCC) 11/24/2013   Chronic insomnia 11/24/2013   Anxiety and depression 11/24/2013   Heartburn 11/24/2013   Mixed diabetic hyperlipidemia associated with type 2 diabetes mellitus (HCC) 11/24/2013   Osteopenia 11/24/2013   Peripheral neuropathy 11/24/2013   Vitamin D deficiency 11/24/2013    Past Surgical History:  Procedure Laterality Date   ABDOMINAL HYSTERECTOMY     APPENDECTOMY     CATARACT  EXTRACTION W/PHACO Left 06/03/2015   Procedure: CATARACT EXTRACTION PHACO AND INTRAOCULAR LENS PLACEMENT (IOC);  Surgeon: Sherald Hess, MD;  Location: [facility];  Service: Ophthalmology;  Laterality: Left;  DIABETIC - oral meds keep Coulibaly together   CATARACT EXTRACTION W/PHACO Right 06/24/2015   Procedure: CATARACT EXTRACTION PHACO AND INTRAOCULAR LENS PLACEMENT (IOC) righr eye;  Surgeon: Sherald Hess, MD;  Location: [facility];  Service: Ophthalmology;  Laterality: Right;  DIABETIC - oral meds   CHOLECYSTECTOMY      OB History   No obstetric history on file.      Home Medications    Prior to Admission medications   Medication Sig Start Date End Date Taking? Authorizing Provider  acetaminophen (TYLENOL) 325 MG tablet Take by mouth.   Yes [provider]  aspirin 81 MG tablet Take 81 mg by mouth daily.   Yes [provider]  Cholecalciferol (VITAMIN D3) 10000 UNITS TABS Take by mouth.   Yes [provider]  Cobalamine Combinations (VITAMIN B12-FOLIC ACID) 500-400 MCG TABS Take by mouth.   Yes [provider]  glucose blood test strip Use once daily. Use as instructed. 06/12/16  Yes [provider]  hydrochlorothiazide (HYDRODIURIL) 12.5 MG tablet Take by mouth. 02/14/18  Yes [provider]  lisinopril (PRINIVIL,ZESTRIL) 10 MG tablet Take 20 mg by mouth 2 (two) times daily.    Yes [provider]  metoprolol succinate (TOPROL-XL) 25 MG 24 hr  tablet Take by mouth. 08/16/17  Yes [provider]  nirmatrelvir/ritonavir EUA (PAXLOVID) TABS Take 3 tablets by mouth 2 (two) times daily for 5 days. Patient GFR is 60. Take nirmatrelvir (150 mg) two tablets twice daily for 5 days and ritonavir (100 mg) one tablet twice daily for 5 days. 08/20/20 08/25/20 Yes Jesyka Slaght G, DO  omeprazole (PRILOSEC) 20 MG capsule Take by mouth. 02/14/18  Yes [provider]  sertraline (ZOLOFT) 25 MG  tablet Take by mouth. 02/14/18  Yes [provider]  simvastatin (ZOCOR) 10 MG tablet Take 10 mg by mouth daily.   Yes [provider]  metFORMIN (GLUCOPHAGE-XR) 500 MG 24 hr tablet Take 500 mg by mouth 2 (two) times daily.  11/17/18  [provider]  nortriptyline (PAMELOR) 50 MG capsule Take 50 mg by mouth at bedtime.  11/17/18  [provider]  promethazine (PHENERGAN) 12.5 MG tablet  12/01/17 11/17/18  [provider]  traZODone (DESYREL) 50 MG tablet Take by mouth. 11/03/17 11/17/18  [provider]    Family History Family History  Problem Relation Age of Onset   Heart disease Mother    Hypertension Mother    Hypertension Father     Social History Social History   Tobacco Use   Smoking status: Never   Smokeless tobacco: Never  Vaping Use   Vaping Use: Never used  Substance Use Topics   Alcohol use: No   Drug use: No     Allergies   Patient has no known allergies.   Review of Systems Review of Systems Per HPI  Physical Exam Triage Vital Signs ED Triage Vitals  Enc Vitals Group     BP 08/20/20 0914 131/77     Pulse Rate 08/20/20 0914 89     Resp 08/20/20 0914 18     Temp 08/20/20 0914 99.2 F (37.3 C)     Temp Source 08/20/20 0914 Oral     SpO2 08/20/20 0914 93 %     Weight 08/20/20 0910 156 lb 1.4 oz (70.8 kg)     Height 08/20/20 0910 5\' 6"  (1.676 m)     Head Circumference --      Peak Flow --      Pain Score 08/20/20 0910 0     Pain Loc --      Pain Edu? --      Excl. in GC? --    Updated Vital Signs BP 131/77 (BP Location: Left Arm)   Pulse 89   Temp 99.2 F (37.3 C) (Oral)   Resp 18   Ht 5\' 6"  (1.676 m)   Wt 70.8 kg   SpO2 93%   BMI 25.19 kg/m   Visual Acuity Right Eye Distance:   Left Eye Distance:   Bilateral Distance:    Right Eye Near:   Left Eye Near:    Bilateral Near:     Physical Exam Vitals and nursing note reviewed.  Constitutional:      General: She is not in acute  distress.    Appearance: Normal appearance. She is not ill-appearing.  Eyes:     General:        Right eye: No discharge.        Left eye: No discharge.     Conjunctiva/sclera: Conjunctivae normal.  Cardiovascular:     Rate and Rhythm: Normal rate and regular rhythm.  Pulmonary:     Effort: Pulmonary effort is normal.     Breath sounds: Normal breath  sounds. No wheezing or rales.  Abdominal:     General: There is no distension.     Palpations: Abdomen is soft.  Neurological:     Mental Status: She is alert.     Comments: Patient is alert.  Hard of hearing. Patient answers questions appropriately. Moves all extremities without difficulty.     Psychiatric:        Mood and Affect: Mood normal.        Behavior: Behavior normal.     UC Treatments / Results  Labs (all labs ordered are listed, but only abnormal results are displayed) Labs Reviewed  URINALYSIS, COMPLETE (UACMP) WITH MICROSCOPIC - Abnormal; Notable for the following components:      Result Value   APPearance HAZY (*)    Protein, ur TRACE (*)    Bacteria, UA FEW (*)    All other components within normal limits  URINE CULTURE    EKG   Radiology No results found.  Procedures Procedures (including critical care time)  Medications Ordered in UC Medications - No data to display  Initial Impression / Assessment and Plan / UC Course  I have reviewed the triage vital signs and the nursing notes.  Pertinent labs & imaging results that were available during my care of the patient were reviewed by me and considered in my medical decision making (see chart for details).    87 year old female presents with COVID-19.  Patient is well-appearing on exam.  Vital signs stable.  Urinalysis was obtained and is not consistent with UTI.  Sending culture.  Placing on Paxlovid.  Advised to hold statin.  Advised son to take her to the hospital if she worsens.  Final Clinical Impressions(s) / UC Diagnoses   Final diagnoses:   COVID     Discharge Instructions      Rest.  Keep a close eye on her. If she worsens, take her to the ER or Call EMS.  She needs to hold the statin (simvastin). Start the Paxlovid 12 hours after the last dose and do not resume it until treatment is complete.     ED Prescriptions     Medication Sig Dispense Auth. Provider   nirmatrelvir/ritonavir EUA (PAXLOVID) TABS Take 3 tablets by mouth 2 (two) times daily for 5 days. Patient GFR is 60. Take nirmatrelvir (150 mg) two tablets twice daily for 5 days and ritonavir (100 mg) one tablet twice daily for 5 days. 30 tablet Tommie Sams, DO      PDMP not reviewed this encounter.   Tommie Sams, DO 08/20/20 1030

## 2020-08-20 NOTE — ED Triage Notes (Signed)
Pt son states pt tested positive this morning for covid. She has a cough, runny nose. Denies fever. Son thing she also may have a UTI because she is a little confused, weak, urinary frequency and nauseous. Started last night.

## 2020-08-21 LAB — URINE CULTURE: Culture: 30000 — AB

## 2021-01-02 DIAGNOSIS — L989 Disorder of the skin and subcutaneous tissue, unspecified: Secondary | ICD-10-CM | POA: Insufficient documentation

## 2021-03-20 DIAGNOSIS — R3915 Urgency of urination: Secondary | ICD-10-CM | POA: Insufficient documentation

## 2021-03-20 DIAGNOSIS — R350 Frequency of micturition: Secondary | ICD-10-CM | POA: Insufficient documentation

## 2021-03-20 DIAGNOSIS — N952 Postmenopausal atrophic vaginitis: Secondary | ICD-10-CM | POA: Insufficient documentation

## 2021-05-14 ENCOUNTER — Other Ambulatory Visit: Payer: Self-pay

## 2021-05-14 ENCOUNTER — Ambulatory Visit
Admission: EM | Admit: 2021-05-14 | Discharge: 2021-05-14 | Disposition: A | Discharge status: Home / Self Care | Payer: Medicare Other

## 2021-05-14 DIAGNOSIS — H7291 Unspecified perforation of tympanic membrane, right ear: Secondary | ICD-10-CM | POA: Diagnosis not present

## 2021-05-14 MED ORDER — AMOXICILLIN 500 MG PO CAPS: 500.0000 mg | ORAL_CAPSULE | Freq: Three times a day (TID) | ORAL | 0 refills | Status: DC

## 2021-05-14 NOTE — ED Provider Notes (Signed)
[facility]    CSN: [id number] Arrival date & time: 05/14/21  0912      History   Chief Complaint Chief Complaint  Patient presents with   Otalgia    HPI Sarah Baird is a 88 y.o. female.   Pt reports right ear pain and blood draining from her ear   The history is provided by the patient. No language interpreter was used.  Otalgia Location:  Right Quality:  Aching Severity:  Moderate Onset quality:  Sudden Duration:  1 day Timing:  Constant Progression:  Worsening Chronicity:  New Relieved by:  Nothing Worsened by:  Nothing Ineffective treatments:  None tried Associated symptoms: no congestion and no fever    Past Medical History:  Diagnosis Date   Depression    Diabetes mellitus without complication (HCC)    Hypercholesteremia    Hypertension    Osteoporosis     Patient Active Problem List   Diagnosis Date Noted   Atrophic vaginitis 03/20/2021   Increased frequency of urination 03/20/2021   Urinary urgency 03/20/2021   Skin lesion of scalp 01/02/2021   Aortic valve sclerosis 07/05/2019   CRI (chronic renal insufficiency), stage 3 (moderate) (HCC) 05/12/2019   PAD (peripheral artery disease) (HCC) 04/11/2019   Exudative age-related macular degeneration of left eye with inactive choroidal neovascularization (HCC) 05/10/2018   Hypertension associated with type 2 diabetes mellitus (HCC) 02/23/2018   Mild cognitive impairment 10/02/2016   Diastolic dysfunction 07/09/2016   Confusion 06/16/2016   Diverticulitis 06/16/2016   GERD (gastroesophageal reflux disease) 06/16/2016   Noncompliance with medications 12/11/2015   Type 2 diabetes, controlled, with neuropathy (HCC) 11/24/2013   Chronic insomnia 11/24/2013   Anxiety and depression 11/24/2013   Heartburn 11/24/2013   Mixed diabetic hyperlipidemia associated with type 2 diabetes mellitus (HCC) 11/24/2013   Osteopenia 11/24/2013   Peripheral neuropathy 11/24/2013   Vitamin D deficiency  11/24/2013    Past Surgical History:  Procedure Laterality Date   ABDOMINAL HYSTERECTOMY     APPENDECTOMY     CATARACT EXTRACTION W/PHACO Left 06/03/2015   Procedure: CATARACT EXTRACTION PHACO AND INTRAOCULAR LENS PLACEMENT (IOC);  Surgeon: Sherald Hess, MD;  Location: [facility];  Service: Ophthalmology;  Laterality: Left;  DIABETIC - oral meds keep Holzkne[place] together   CATARACT EXTRACTION W/PHACO Right 06/24/2015   Procedure: CATARACT EXTRACTION PHACO AND INTRAOCULAR LENS PLACEMENT (IOC) righr eye;  Surgeon: Sherald Hess, MD;  Location: [facility];  Service: Ophthalmology;  Laterality: Right;  DIABETIC - oral meds   CHOLECYSTECTOMY      OB History   No obstetric history on file.      Home Medications    Prior to Admission medications   Medication Sig Start Date End Date Taking? Authorizing Provider  acetaminophen (TYLENOL) 325 MG tablet Take by mouth.   Yes [provider]  amoxicillin (AMOXIL) 500 MG capsule Take 1 capsule (500 mg total) by mouth 3 (three) times daily. 05/14/21  Yes Elson Areas, PA-C  aspirin 81 MG tablet Take 81 mg by mouth daily.   Yes [provider]  Blood Glucose Calibration (ACCU-CHEK GUIDE CONTROL) LIQD  02/20/21  Yes [provider]  Cholecalciferol (VITAMIN D3) 10000 UNITS TABS Take by mouth.   Yes [provider]  Cobalamine Combinations (VITAMIN B12-FOLIC ACID) 500-400 MCG TABS Take by mouth.   Yes [provider]  diphenhydrAMINE HCl 50 MG/30ML LIQD Take by mouth.   Yes [provider]  estradiol (ESTRACE) 0.1  MG/GM vaginal cream Place vaginally. 03/24/21 03/24/22 Yes [provider]  glucose blood ([employer] BLOOD GLUCOSE TEST) test strip 1 each (1 strip total) by XX route once daily Use as instructed. 04/07/19  Yes [provider]  hydrochlorothiazide (HYDRODIURIL) 25 MG tablet Take 25 mg by mouth daily. 12.5 mg ? 04/04/21  Yes [provider]  lisinopril (ZESTRIL) 40 MG tablet Take 40 mg by mouth daily. 04/04/21  Yes [provider]  metoprolol succinate (TOPROL-XL) 25 MG 24 hr tablet Take by mouth. 08/16/17  Yes [provider]  omeprazole (PRILOSEC) 20 MG capsule Take by mouth. 02/14/18  Yes [provider]  Pharmacist Choice Lancets MISC  02/20/21  Yes [provider]  sertraline (ZOLOFT) 100 MG tablet Take by mouth. 01/02/21 01/02/22 Yes [provider]  simvastatin (ZOCOR) 20 MG tablet Take by mouth. 05/20/20  Yes [provider]  calcium carbonate (OSCAL) 1500 (600 Ca) MG TABS tablet Take by mouth.    [provider]  solifenacin (VESICARE) 5 MG tablet Take 5 mg by mouth daily. 03/20/21   [provider]  metFORMIN (GLUCOPHAGE-XR) 500 MG 24 hr tablet Take 500 mg by mouth 2 (two) times daily.  11/17/18  [provider]  nortriptyline (PAMELOR) 50 MG capsule Take 50 mg by mouth at bedtime.  11/17/18  [provider]  promethazine (PHENERGAN) 12.5 MG tablet  12/01/17 11/17/18  [provider]  traZODone (DESYREL) 50 MG tablet Take by mouth. 11/03/17 11/17/18  [provider]    Family History Family History  Problem Relation Age of Onset   Heart disease Mother    Hypertension Mother    Hypertension Father     Social History Social History   Tobacco Use   Smoking status: Never   Smokeless tobacco: Never  Vaping Use   Vaping Use: Never used  Substance Use Topics   Alcohol use: No   Drug use: No     Allergies   Patient has no known allergies.   Review of Systems Review of Systems  Constitutional:  Negative for fever.  HENT:  Positive for ear pain. Negative for congestion.   All other systems reviewed and are negative.   Physical Exam Triage Vital Signs ED Triage Vitals  Enc Vitals Group     BP 05/14/21 0950 (!) 153/64     Pulse Rate 05/14/21 0950 72     Resp 05/14/21 0950 18     Temp 05/14/21 0950  97.6 F (36.4 C)     Temp Source 05/14/21 0950 Oral     SpO2 05/14/21 0950 99 %     Weight 05/14/21 0946 156 lb 1.4 oz (70.8 kg)     Height --      Head Circumference --      Peak Flow --      Pain Score 05/14/21 0945 0     Pain Loc --      Pain Edu? --      Excl. in GC? --    No data found.  Updated Vital Signs BP (!) 153/64 (BP Location: Left Arm)   Pulse 72   Temp 97.6 F (36.4 C) (Oral)   Resp 18   Wt 70.8 kg   SpO2 99%   BMI 25.19 kg/m   Visual Acuity Right Eye Distance:   Left Eye Distance:   Bilateral Distance:    Right Eye Near:   Left Eye Near:    Bilateral Near:  Physical Exam Vitals and nursing note reviewed.  Constitutional:      Appearance: She is well-developed.  HENT:     Head: Normocephalic.     Left Ear: Tympanic membrane normal.     Ears:     Comments: Blood ear canal,  I cannot visualize Tm      Mouth/Throat:     Mouth: Mucous membranes are moist.  Pulmonary:     Effort: Pulmonary effort is normal.  Abdominal:     General: There is no distension.  Musculoskeletal:        General: Normal range of motion.     Cervical back: Normal range of motion.  Neurological:     Mental Status: She is alert and oriented to person, place, and time.     UC Treatments / Results  Labs (all labs ordered are listed, but only abnormal results are displayed) Labs Reviewed - No data to display  EKG   Radiology No results found.  Procedures Procedures (including critical care time)  Medications Ordered in UC Medications - No data to display  Initial Impression / Assessment and Plan / UC Course  I have reviewed the triage vital signs and the nursing notes.  Pertinent labs & imaging results that were available during my care of the patient were reviewed by me and considered in my medical decision making (see chart for details).     MDM:  I suspect perforation of tm.  Pt advised to see ENt for evaltuion.  Rx for amoxicillian Final Clinical  Impressions(s) / UC Diagnoses   Final diagnoses:  Perforation of ear drum, right     Discharge Instructions      See the ENT for recheck next week.     ED Prescriptions     Medication Sig Dispense Auth. Provider   amoxicillin (AMOXIL) 500 MG capsule Take 1 capsule (500 mg total) by mouth 3 (three) times daily. 30 capsule Elson Areas, [place]      PDMP not reviewed this encounter. An After Visit Summary was printed and given to the patient.    Elson Areas, [place] 05/14/21 1022

## 2021-05-14 NOTE — Discharge Instructions (Addendum)
See the ENT for recheck next week.   ?

## 2021-05-14 NOTE — ED Triage Notes (Signed)
Patient is here for "Right Ear Pain". Some bleeding on pillow during night "from right ear". History of hearing loss and normally wears hearing aids. "Heard something pop prior".  ?

## 2021-06-19 ENCOUNTER — Other Ambulatory Visit: Payer: Self-pay

## 2021-06-19 ENCOUNTER — Ambulatory Visit
Admission: EM | Admit: 2021-06-19 | Discharge: 2021-06-19 | Disposition: A | Discharge status: Home / Self Care | Payer: Medicare Other | Attending: Physician Assistant | Admitting: Physician Assistant

## 2021-06-19 DIAGNOSIS — R4789 Other speech disturbances: Secondary | ICD-10-CM | POA: Insufficient documentation

## 2021-06-19 DIAGNOSIS — R350 Frequency of micturition: Secondary | ICD-10-CM | POA: Diagnosis present

## 2021-06-19 LAB — URINALYSIS, ROUTINE W REFLEX MICROSCOPIC
Bilirubin Urine: NEGATIVE
Glucose, UA: NEGATIVE mg/dL
Hgb urine dipstick: NEGATIVE
Ketones, ur: NEGATIVE mg/dL
Leukocytes,Ua: NEGATIVE
Nitrite: NEGATIVE
Protein, ur: NEGATIVE mg/dL
Specific Gravity, Urine: 1.01 (ref 1.005–1.030)
pH: 6.5 (ref 5.0–8.0)

## 2021-06-19 NOTE — ED Triage Notes (Signed)
Pt c/o Urinary frequency, Urinary burning x2days  Pt is having some fatigue, slurred speech, and confusion.

## 2021-06-19 NOTE — ED Provider Notes (Signed)
[facility]    CSN: [id number] Arrival date & time: 06/19/21  1421      History   Chief Complaint Chief Complaint  Patient presents with   Urinary Tract Infection    HPI Sarah Baird is a 88 y.o. female who is presenting with her son for slow and occasionally slurred speech since yesterday.  He says that she was more fatigued and talking slower yesterday but appears to be a little better today after resting and taking Tylenol.  He says she was little confused yesterday but does not appear to be confused today.  She is not complaining of any headaches.  She is walking slowly but otherwise not off balance.  No facial drooping, vomiting.  Patient not complaining of chest pain or breathing difficulty.  Patient's son believes she could potentially have a UTI as she had similar symptoms a couple months ago when she had a urinary infection.  Patient has had urinary frequency but she denies associated painful urination to me.  No abdominal pain.  No history of stroke or TIA.  She does have medical history significant for hypertension, hyperlipidemia depression and diabetes.  Patient does not take any diabetes medication she controls with diet and exercise and last A1c was 6.4 in December 2022.  HPI  Past Medical History:  Diagnosis Date   Depression    Diabetes mellitus without complication (HCC)    Hypercholesteremia    Hypertension    Osteoporosis     Patient Active Problem List   Diagnosis Date Noted   Atrophic vaginitis 03/20/2021   Increased frequency of urination 03/20/2021   Urinary urgency 03/20/2021   Skin lesion of scalp 01/02/2021   Aortic valve sclerosis 07/05/2019   CRI (chronic renal insufficiency), stage 3 (moderate) (HCC) 05/12/2019   PAD (peripheral artery disease) (HCC) 04/11/2019   Exudative age-related macular degeneration of left eye with inactive choroidal neovascularization (HCC) 05/10/2018   Hypertension associated with type 2 diabetes mellitus  (HCC) 02/23/2018   Mild cognitive impairment 10/02/2016   Diastolic dysfunction 07/09/2016   Confusion 06/16/2016   Diverticulitis 06/16/2016   GERD (gastroesophageal reflux disease) 06/16/2016   Noncompliance with medications 12/11/2015   Type 2 diabetes, controlled, with neuropathy (HCC) 11/24/2013   Chronic insomnia 11/24/2013   Anxiety and depression 11/24/2013   Heartburn 11/24/2013   Mixed diabetic hyperlipidemia associated with type 2 diabetes mellitus (HCC) 11/24/2013   Osteopenia 11/24/2013   Peripheral neuropathy 11/24/2013   Vitamin D deficiency 11/24/2013    Past Surgical History:  Procedure Laterality Date   ABDOMINAL HYSTERECTOMY     APPENDECTOMY     CATARACT EXTRACTION W/PHACO Left 06/03/2015   Procedure: CATARACT EXTRACTION PHACO AND INTRAOCULAR LENS PLACEMENT (IOC);  Surgeon: Sherald Hess, MD;  Location: [facility];  Service: Ophthalmology;  Laterality: Left;  DIABETIC - oral meds keep [place] together   CATARACT EXTRACTION W/PHACO Right 06/24/2015   Procedure: CATARACT EXTRACTION PHACO AND INTRAOCULAR LENS PLACEMENT (IOC) righr eye;  Surgeon: Sherald Hess, MD;  Location: [facility];  Service: Ophthalmology;  Laterality: Right;  DIABETIC - oral meds   CHOLECYSTECTOMY      OB History   No obstetric history on file.      Home Medications    Prior to Admission medications   Medication Sig Start Date End Date Taking? Authorizing Provider  acetaminophen (TYLENOL) 325 MG tablet Take by mouth.   Yes [provider]  aspirin 81 MG tablet Take 81 mg by mouth  daily.   Yes [provider]  Blood Glucose Calibration (ACCU-CHEK GUIDE CONTROL) LIQD  02/20/21  Yes [provider]  calcium carbonate (OSCAL) 1500 (600 Ca) MG TABS tablet Take by mouth.   Yes [provider]  Cholecalciferol (VITAMIN D3) 10000 UNITS TABS Take by mouth.   Yes [provider]  Cobalamine Combinations (VITAMIN  B12-FOLIC ACID) 500-400 MCG TABS Take by mouth.   Yes [provider]  diphenhydrAMINE HCl 50 MG/30ML LIQD Take by mouth.   Yes [provider]  glucose blood ([employer] BLOOD GLUCOSE TEST) test strip 1 each (1 strip total) by XX route once daily Use as instructed. 04/07/19  Yes [provider]  hydrochlorothiazide (HYDRODIURIL) 25 MG tablet Take 25 mg by mouth daily. 12.5 mg ? 04/04/21  Yes [provider]  lisinopril (ZESTRIL) 40 MG tablet Take 40 mg by mouth daily. 04/04/21  Yes [provider]  metoprolol succinate (TOPROL-XL) 25 MG 24 hr tablet Take by mouth. 08/16/17  Yes [provider]  omeprazole (PRILOSEC) 20 MG capsule Take by mouth. 02/14/18  Yes [provider]  Pharmacist Choice Lancets MISC  02/20/21  Yes [provider]  sertraline (ZOLOFT) 100 MG tablet Take by mouth. 01/02/21 01/02/22 Yes [provider]  simvastatin (ZOCOR) 20 MG tablet Take by mouth. 05/20/20  Yes [provider]  solifenacin (VESICARE) 5 MG tablet Take 5 mg by mouth daily. 03/20/21  Yes [provider]  amoxicillin (AMOXIL) 500 MG capsule Take 1 capsule (500 mg total) by mouth 3 (three) times daily. 05/14/21   Elson Areas, PA-C  estradiol (ESTRACE) 0.1 MG/GM vaginal cream Place vaginally. 03/24/21 03/24/22  [provider]  metFORMIN (GLUCOPHAGE-XR) 500 MG 24 hr tablet Take 500 mg by mouth 2 (two) times daily.  11/17/18  [provider]  nortriptyline (PAMELOR) 50 MG capsule Take 50 mg by mouth at bedtime.  11/17/18  [provider]  promethazine (PHENERGAN) 12.5 MG tablet  12/01/17 11/17/18  [provider]  traZODone (DESYREL) 50 MG tablet Take by mouth. 11/03/17 11/17/18  [provider]    Family History Family History  Problem Relation Age of Onset   Heart disease Mother    Hypertension Mother    Hypertension Father     Social History Social History   Tobacco Use    Smoking status: Never   Smokeless tobacco: Never  Vaping Use   Vaping Use: Never used  Substance Use Topics   Alcohol use: No   Drug use: No     Allergies   Patient has no known allergies.   Review of Systems Review of Systems  Constitutional:  Positive for fatigue. Negative for chills and fever.  Gastrointestinal:  Negative for abdominal pain, diarrhea, nausea and vomiting.  Genitourinary:  Positive for frequency. Negative for decreased urine volume, dysuria, flank pain, hematuria, pelvic pain, urgency, vaginal bleeding, vaginal discharge and vaginal pain.  Musculoskeletal:  Negative for back pain.  Skin:  Negative for rash.  Neurological:  Positive for speech difficulty. Negative for dizziness, syncope, facial asymmetry, weakness, numbness and headaches.  Psychiatric/Behavioral:  Negative for confusion.     Physical Exam Triage Vital Signs ED Triage Vitals  Enc Vitals Group     BP 06/19/21 1534 131/68     Pulse Rate 06/19/21 1534 68     Resp 06/19/21 1534 18     Temp 06/19/21 1534 97.7 F (36.5 C)     Temp Source 06/19/21 1534 Oral  SpO2 06/19/21 1534 100 %     Weight 06/19/21 1531 150 lb (68 kg)     Height 06/19/21 1531 5\' 7"  (1.702 m)     Head Circumference --      Peak Flow --      Pain Score --      Pain Loc --      Pain Edu? --      Excl. in GC? --    No data found.  Updated Vital Signs BP 131/68 (BP Location: Left Arm)   Pulse 68   Temp 97.7 F (36.5 C) (Oral)   Resp 18   Ht 5\' 7"  (1.702 m)   Wt 150 lb (68 kg)   SpO2 100%   BMI 23.49 kg/m      Physical Exam Vitals and nursing note reviewed.  Constitutional:      General: She is not in acute distress.    Appearance: Normal appearance. She is not ill-appearing or toxic-appearing.  HENT:     Head: Normocephalic and atraumatic.     Nose: Nose normal.     Mouth/Throat:     Mouth: Mucous membranes are moist.     Pharynx: Oropharynx is clear.  Eyes:     General: No scleral icterus.        Right eye: No discharge.        Left eye: No discharge.     Extraocular Movements: Extraocular movements intact.     Conjunctiva/sclera: Conjunctivae normal.     Pupils: Pupils are equal, round, and reactive to light.  Cardiovascular:     Rate and Rhythm: Normal rate and regular rhythm.     Heart sounds: Normal heart sounds.  Pulmonary:     Effort: Pulmonary effort is normal. No respiratory distress.     Breath sounds: Normal breath sounds.  Abdominal:     Palpations: Abdomen is soft.     Tenderness: There is no abdominal tenderness. There is no right CVA tenderness or left CVA tenderness.  Musculoskeletal:     Cervical back: Neck supple.  Skin:    General: Skin is dry.  Neurological:     Mental Status: She is alert and oriented to person, place, and time.     Cranial Nerves: No cranial nerve deficit.     Motor: No weakness.     Coordination: Coordination normal.     Gait: Gait normal.     Comments: Slow rate of speech with occasional slurred words  5 out of 5 strength bilateral upper and lower extremities  Psychiatric:        Mood and Affect: Mood normal.        Behavior: Behavior normal.        Thought Content: Thought content normal.     UC Treatments / Results  Labs (all labs ordered are listed, but only abnormal results are displayed) Labs Reviewed  URINE CULTURE  URINALYSIS, ROUTINE W REFLEX MICROSCOPIC    EKG   Radiology No results found.  Procedures Procedures (including critical care time)  Medications Ordered in UC Medications - No data to display  Initial Impression / Assessment and Plan / UC Course  I have reviewed the triage vital signs and the nursing notes.  Pertinent labs & imaging results that were available during my care of the patient were reviewed by me and considered in my medical decision making (see chart for details).  88 year old female presenting with her son for slowed and slurred speech since yesterday.  Confusion  yesterday but  not today.  No reports of facial drooping, balance issues, difficulty swallowing or breathing, numbness or tingling, headache, chest pain, breathing difficulty.  Some mild urinary frequency.  Vitals are normal and stable patient is overall well-appearing.  Able answer all of my questions correctly.  Alert and oriented x3.  Normal neurological exam other than she has slowed and occasionally slurred speech.  Has clear to auscultation heart regular rate rhythm.  Urinalysis completely normal.  We will send urine for culture but doubt she has a UTI.  I have advised going to the emergency department at this time for further work-up.  I believe it is possible she could have had a TIA yesterday.  Patient's son does not want to take her to the ER.  Understands the risk of not taking her.  Advised condition could worsen if she has something serious going on which I am not able to determine an urgent care.  Advise she needs a head CT.  Patient's son states that he will call her doctor tomorrow.  I reviewed red flag signs and symptoms for him to look out for and advised to call EMS or take to ER if any of those occur.   Final Clinical Impressions(s) / UC Diagnoses   Final diagnoses:  Slow rate of speech  Urinary frequency     Discharge Instructions      -I have advised to take to ER for evaluation for slow speech.  If you do not want to take her to the ER at this point, you need to watch her very closely.  Take her if she becomes more lethargic, confused, complains of headaches, falls or has balance issues, vomits, complains of chest pain and has difficulty breathing, increased weakness, facial drooping, has difficulty swallowing or talking. - Call her PCP immediately tomorrow morning if you do not take her to the ER today.  She needs further work-up for this slow speech.  I am concerned she might of had a mini stroke yesterday.     ED Prescriptions   None    PDMP not reviewed this encounter.    Shirlee Latchaves, Venna Berberich B, PA-C 06/19/21 1623

## 2021-06-19 NOTE — Discharge Instructions (Signed)
-  I have advised to take to ER for evaluation for slow speech.  If you do not want to take her to the ER at this point, you need to watch her very closely.  Take her if she becomes more lethargic, confused, complains of headaches, falls or has balance issues, vomits, complains of chest pain and has difficulty breathing, increased weakness, facial drooping, has difficulty swallowing or talking. - Call her PCP immediately tomorrow morning if you do not take her to the ER today.  She needs further work-up for this slow speech.  I am concerned she might of had a mini stroke yesterday.

## 2021-06-21 LAB — URINE CULTURE

## 2023-06-03 DEATH — deceased
